# Patient Record
Sex: Male | Born: 1995 | Hispanic: Yes | Marital: Single | Smoking: Current some day smoker
Health system: Southern US, Community
[De-identification: ages and names within clinical notes are randomized; demographics above are authoritative.]

## PROBLEM LIST (undated history)

## (undated) DIAGNOSIS — F329 Major depressive disorder, single episode, unspecified: Secondary | ICD-10-CM

## (undated) DIAGNOSIS — R569 Unspecified convulsions: Secondary | ICD-10-CM

## (undated) HISTORY — DX: Unspecified convulsions: R56.9

## (undated) HISTORY — PX: TONSILLECTOMY: SUR1361

## (undated) HISTORY — DX: Major depressive disorder, single episode, unspecified: F32.9

---

## 2016-01-08 ENCOUNTER — Encounter: Payer: Self-pay | Admitting: Emergency Medicine

## 2016-01-08 ENCOUNTER — Emergency Department
Admission: EM | Admit: 2016-01-08 | Discharge: 2016-01-09 | Disposition: A | Discharge status: Other Acute Inpatient Hospital | Payer: 59 | Attending: Emergency Medicine | Admitting: Emergency Medicine

## 2016-01-08 DIAGNOSIS — F32A Depression, unspecified: Secondary | ICD-10-CM

## 2016-01-08 DIAGNOSIS — F329 Major depressive disorder, single episode, unspecified: Secondary | ICD-10-CM | POA: Diagnosis not present

## 2016-01-08 DIAGNOSIS — Z5181 Encounter for therapeutic drug level monitoring: Secondary | ICD-10-CM | POA: Insufficient documentation

## 2016-01-08 DIAGNOSIS — R45851 Suicidal ideations: Secondary | ICD-10-CM | POA: Diagnosis present

## 2016-01-08 DIAGNOSIS — F1290 Cannabis use, unspecified, uncomplicated: Secondary | ICD-10-CM | POA: Diagnosis not present

## 2016-01-08 HISTORY — DX: Depression, unspecified: F32.A

## 2016-01-08 LAB — CBC WITH DIFFERENTIAL/PLATELET
BASOS ABS: 0.1 10*3/uL (ref 0–0.1)
Basophils Relative: 1 %
EOS ABS: 0 10*3/uL (ref 0–0.7)
EOS PCT: 0 %
HCT: 40.8 % (ref 40.0–52.0)
HEMOGLOBIN: 13.8 g/dL (ref 13.0–18.0)
LYMPHS ABS: 1.9 10*3/uL (ref 1.0–3.6)
LYMPHS PCT: 22 %
MCH: 32 pg (ref 26.0–34.0)
MCHC: 33.8 g/dL (ref 32.0–36.0)
MCV: 94.7 fL (ref 80.0–100.0)
Monocytes Absolute: 1.2 10*3/uL — ABNORMAL HIGH (ref 0.2–1.0)
Monocytes Relative: 14 %
NEUTROS PCT: 63 %
Neutro Abs: 5.5 10*3/uL (ref 1.4–6.5)
PLATELETS: 247 10*3/uL (ref 150–440)
RBC: 4.31 MIL/uL — AB (ref 4.40–5.90)
RDW: 13.2 % (ref 11.5–14.5)
WBC: 8.7 10*3/uL (ref 3.8–10.6)

## 2016-01-08 LAB — URINE DRUG SCREEN, QUALITATIVE (ARMC ONLY)
AMPHETAMINES, UR SCREEN: NOT DETECTED
Barbiturates, Ur Screen: NOT DETECTED
Benzodiazepine, Ur Scrn: NOT DETECTED
COCAINE METABOLITE, UR ~~LOC~~: NOT DETECTED
Cannabinoid 50 Ng, Ur ~~LOC~~: POSITIVE — AB
MDMA (ECSTASY) UR SCREEN: NOT DETECTED
Methadone Scn, Ur: NOT DETECTED
Opiate, Ur Screen: NOT DETECTED
Phencyclidine (PCP) Ur S: NOT DETECTED
TRICYCLIC, UR SCREEN: NOT DETECTED

## 2016-01-08 LAB — COMPREHENSIVE METABOLIC PANEL
ALT: 26 U/L (ref 17–63)
ANION GAP: 6 (ref 5–15)
AST: 29 U/L (ref 15–41)
Albumin: 4.7 g/dL (ref 3.5–5.0)
Alkaline Phosphatase: 79 U/L (ref 38–126)
BUN: 16 mg/dL (ref 6–20)
CALCIUM: 9.3 mg/dL (ref 8.9–10.3)
CHLORIDE: 103 mmol/L (ref 101–111)
CO2: 28 mmol/L (ref 22–32)
CREATININE: 0.91 mg/dL (ref 0.61–1.24)
Glucose, Bld: 114 mg/dL — ABNORMAL HIGH (ref 65–99)
Potassium: 3.5 mmol/L (ref 3.5–5.1)
SODIUM: 137 mmol/L (ref 135–145)
Total Bilirubin: 1.2 mg/dL (ref 0.3–1.2)
Total Protein: 7.8 g/dL (ref 6.5–8.1)

## 2016-01-08 LAB — ETHANOL

## 2016-01-08 NOTE — ED Triage Notes (Addendum)
Pt presents to ED with c/o anxiety/panick attacks/depression, associated nausea. Pt states he he is been using marijuana and stopped using yesterday. Pt states he been depressed due his father passing. 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

## 2016-01-09 ENCOUNTER — Inpatient Hospital Stay
Admission: RE | Admit: 2016-01-09 | Discharge: 2016-01-16 | DRG: 885 | Disposition: A | Discharge status: Home / Self Care | Payer: No Typology Code available for payment source | Source: Intra-hospital | Attending: Psychiatry | Admitting: Psychiatry

## 2016-01-09 DIAGNOSIS — F323 Major depressive disorder, single episode, severe with psychotic features: Secondary | ICD-10-CM | POA: Diagnosis present

## 2016-01-09 DIAGNOSIS — F329 Major depressive disorder, single episode, unspecified: Secondary | ICD-10-CM | POA: Diagnosis present

## 2016-01-09 DIAGNOSIS — R45851 Suicidal ideations: Secondary | ICD-10-CM | POA: Diagnosis present

## 2016-01-09 DIAGNOSIS — G4700 Insomnia, unspecified: Secondary | ICD-10-CM | POA: Diagnosis present

## 2016-01-09 DIAGNOSIS — Z811 Family history of alcohol abuse and dependence: Secondary | ICD-10-CM

## 2016-01-09 DIAGNOSIS — F1220 Cannabis dependence, uncomplicated: Secondary | ICD-10-CM | POA: Diagnosis present

## 2016-01-09 DIAGNOSIS — Z7901 Long term (current) use of anticoagulants: Secondary | ICD-10-CM

## 2016-01-09 DIAGNOSIS — F29 Unspecified psychosis not due to a substance or known physiological condition: Secondary | ICD-10-CM

## 2016-01-09 MED ORDER — HYDROXYZINE HCL 25 MG PO TABS: 25.0000 mg | ORAL_TABLET | Freq: Three times a day (TID) | ORAL | Status: DC | PRN

## 2016-01-09 MED ORDER — INFLUENZA VAC SPLIT QUAD 0.5 ML IM SUSY
0.5000 mL | PREFILLED_SYRINGE | INTRAMUSCULAR | Status: AC
  Administered 2016-01-10: 0.5 mL via INTRAMUSCULAR
  Filled 2016-01-09: qty 0.5

## 2016-01-09 MED ORDER — ACETAMINOPHEN 325 MG PO TABS
650.0000 mg | ORAL_TABLET | Freq: Four times a day (QID) | ORAL | Status: DC | PRN
  Administered 2016-01-13: 650 mg via ORAL

## 2016-01-09 MED ORDER — TRAZODONE HCL 50 MG PO TABS: 50.0000 mg | ORAL_TABLET | Freq: Every evening | ORAL | Status: DC | PRN

## 2016-01-09 MED ORDER — ALUM & MAG HYDROXIDE-SIMETH 200-200-20 MG/5ML PO SUSP: 30.0000 mL | ORAL | Status: DC | PRN

## 2016-01-09 MED ORDER — MAGNESIUM HYDROXIDE 400 MG/5ML PO SUSP: 30.0000 mL | Freq: Every day | ORAL | Status: DC | PRN

## 2016-01-09 NOTE — Progress Notes (Signed)
[facility] by Dr. Toni Amendlapacs.  Attending Physician will be Dr. Ardyth HarpsHernandez.   [facility] Monica BectonKaneisha.   Intake Paper Work has been signed and placed on patient chart.  ER staff is aware of the admission [name]( Lisa, ER Sect.; Dr. Don PerkingVeronese, ER MD; Marchelle FolksAmanda, Patient's Nurse & [name]). [name], LCAS-A, LPC-A, [facility]  Counselor 01/09/2016 1:17 PM

## 2016-01-09 NOTE — ED Notes (Signed)
Pt presents to ED with c/o depression, pt states "I have been feeling depressed and I have been doing marijuana and know I am detoxing from it," Pt denies use of marijuana today. Pt reports having thoughts of SI but no plan. Pt appears anxious but is cooperative. Pt alert and oriented x 4.

## 2016-01-09 NOTE — Consult Note (Signed)
[facility] Face-to-Face Psychiatry Consult   Reason for Consult:  Consult for this 20 year old student from [employer] brought in because of depression with suicidal thoughts. Referring Physician:  Eual Fines Patient Identification: Dillon Shannon MRN:  [id number] Principal Diagnosis: Severe major depression, single episode, with psychotic features [facility]) Diagnosis:   Patient Active Problem List   Diagnosis Date Noted  . Severe major depression, single episode, with psychotic features ([place]) [F32.3] 01/09/2016  . Cannabis abuse [F12.10] 01/09/2016    Total Time spent with patient: 1 hour  Subjective:   Dillon Shannon is a 20 y.o. male patient admitted with "I am anxious, and my heart hurts".  HPI:  Patient interviewed. Chart reviewed. Labs and vitals reviewed. 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 Patient was a little bit difficult to interview today not because of any language problem but because of his mental state. He seemed to be very slow and sluggish in his thinking. He tells me that he has only been depressed for about 2 days. Prior to that he said he had been smoking marijuana heavily every day for the past month. He says that he was doing that so that he could "escape" from his life problems although it's a little vague what those are. He admits that he has been sleeping poorly and eating poorly for several weeks. Has been attending classes less than usual and not doing well in classes. 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 Patient had made some indications about having some suicidal thoughts without specific plan when he came in. When I spoke to him today he said that he did not have any specific thoughts about killing himself but he felt terrible about himself very hopeless. He answered positively that he had been having hallucinations although he would not  give me any further details than that. Seem to be distressed when we talked about it. Patient claimed to me that he had had a large amount of alcohol yesterday although his blood alcohol level on presentation was negative. He is not currently receiving any kind of outpatient psychiatric treatment. Additional stressors include worry about his family. He says he has an uncle who died recently although he doesn't give me any more detail about that. Evidently his father died about 2 years ago and that was a major and traumatic event in his life.  Social history: Patient is from [place] and tells me that he does not have any family here in the [place]. 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 He says there is a family who is helping to support him in school and he feels very guilty about how he is "letting down" those people among others.  Medical history: Denies any significant medical problems  Substance abuse history: Says that he was smoking a little bit of marijuana recreationally over the summer but that for the past month he has been doing it every day. He says he stopped 2 days ago and now indicates that he feels overwhelmed with guilt about the whole situation. Denies he was using any other drugs. Claims he used a lot of alcohol yesterday again but his alcohol level was negative.  Past Psychiatric History: Patient denies any past psychiatric treatment at all. Says he's never seen a counselor or therapist of any sort. No psychiatric hospitalization. No history of suicide attempts. No history of psychiatric medicine.  Risk to Self: Suicidal Ideation: Yes-Currently Present Suicidal Intent: No Is patient at risk for suicide?: No Suicidal Plan?: No Access to Means: No What has been your use of drugs/alcohol within the last 12 months?: use of marijuana daily, rare use of alcohol How many times?: 0 Other Self Harm Risks: denied Triggers for Past Attempts: Unknown Intentional Self  Injurious Behavior: None Risk to Others: Homicidal Ideation: No Thoughts of Harm to Others: No Current Homicidal Intent: No Current Homicidal Plan: No Access to Homicidal Means: No Identified Victim: None identified History of harm to others?: No Assessment of Violence: None Noted Violent Behavior Description: denied Does patient have access to weapons?: No Criminal Charges Pending?: No Does patient have a court date: No Prior Inpatient Therapy: Prior Inpatient Therapy: Yes Prior Therapy Dates: 2015 Prior Therapy Facilty/Provider(s): [facility] in [place] Reason for Treatment: Depression, PTSD Prior Outpatient Therapy: Prior Outpatient Therapy: No Prior Therapy Dates: n/a Prior Therapy Facilty/Provider(s): n/a Reason for Treatment: n/a Does patient have an ACCT team?: No Does patient have Intensive In-House Services?  : No Does patient have [facility] services? : No Does patient have P4CC services?: No  Past Medical History: History reviewed. No pertinent past medical history. History reviewed. No pertinent surgical history. Family History: History reviewed. No pertinent family history. Family Psychiatric  History: Patient said that his mother was "not right" but couldn't describe her in any more detail than that. His late father had a drinking problem and a problem with violent behavior when intoxicated. Social History:  History  Alcohol use Not on file     History  Drug Use  . Types: Marijuana    Social History   Social History  . Marital status: Single    Spouse name: N/A  . Number of children: N/A  . Years of education: N/A   Social History Main Topics  . Smoking status: Never Smoker  . Smokeless tobacco: Never Used  . Alcohol use None  . Drug use:     Types: Marijuana  . Sexual activity: Not Asked   Other Topics Concern  . None   Social History Narrative  . None   Additional Social History:    Allergies:  No Known Allergies  Labs:  Results for orders  placed or performed during the hospital encounter of 01/08/16 (from the past 48 hour(s))  Comprehensive metabolic panel     Status: Abnormal   Collection Time: 01/08/16 10:56 PM  Result Value Ref Range   Sodium 137 135 - 145 mmol/L   Potassium 3.5 3.5 - 5.1 mmol/L   Chloride 103 101 - 111 mmol/L   CO2 28 22 - 32 mmol/L   Glucose, Bld 114 (H) 65 - 99 mg/dL   BUN 16 6 - 20 mg/dL   Creatinine, Ser 0.91 0.61 - 1.24 mg/dL   Calcium 9.3 8.9 - 10.3 mg/dL   Total Protein 7.8 6.5 - 8.1 g/dL   Albumin 4.7 3.5 - 5.0 g/dL   AST 29 15 - 41 U/L   ALT 26 17 - 63 U/L   Alkaline Phosphatase 79 38 - 126 U/L   Total Bilirubin 1.2 0.3 - 1.2 mg/dL   GFR calc non Af Amer >60 >60 mL/min   GFR calc Af Amer >60 >60 mL/min    Comment: (NOTE) The eGFR has been calculated using the CKD EPI equation. This calculation has not been validated in all clinical situations. eGFR's persistently <60 mL/min signify possible Chronic Kidney Disease.    Anion gap 6  5 - 15  Ethanol     Status: None   Collection Time: 01/08/16 10:56 PM  Result Value Ref Range   Alcohol, Ethyl (B) <5 <5 mg/dL    Comment:        LOWEST DETECTABLE LIMIT FOR SERUM ALCOHOL IS 5 mg/dL FOR MEDICAL PURPOSES ONLY   CBC with Diff     Status: Abnormal   Collection Time: 01/08/16 10:56 PM  Result Value Ref Range   WBC 8.7 3.8 - 10.6 K/uL   RBC 4.31 (L) 4.40 - 5.90 MIL/uL   Hemoglobin 13.8 13.0 - 18.0 g/dL   HCT 40.8 40.0 - 52.0 %   MCV 94.7 80.0 - 100.0 fL   MCH 32.0 26.0 - 34.0 pg   MCHC 33.8 32.0 - 36.0 g/dL   RDW 13.2 11.5 - 14.5 %   Platelets 247 150 - 440 K/uL   Neutrophils Relative % 63 %   Neutro Abs 5.5 1.4 - 6.5 K/uL   Lymphocytes Relative 22 %   Lymphs Abs 1.9 1.0 - 3.6 K/uL   Monocytes Relative 14 %   Monocytes Absolute 1.2 (H) 0.2 - 1.0 K/uL   Eosinophils Relative 0 %   Eosinophils Absolute 0.0 0 - 0.7 K/uL   Basophils Relative 1 %   Basophils Absolute 0.1 0 - 0.1 K/uL  Urine Drug Screen, Qualitative ([facility] only)      Status: Abnormal   Collection Time: 01/08/16 10:56 PM  Result Value Ref Range   Tricyclic, Ur Screen NONE DETECTED NONE DETECTED   Amphetamines, Ur Screen NONE DETECTED NONE DETECTED   MDMA (Ecstasy)Ur Screen NONE DETECTED NONE DETECTED   Cocaine Metabolite,Ur [place] NONE DETECTED NONE DETECTED   Opiate, Ur Screen NONE DETECTED NONE DETECTED   Phencyclidine (PCP) Ur S NONE DETECTED NONE DETECTED   Cannabinoid 50 Ng, Ur [place] POSITIVE (A) NONE DETECTED   Barbiturates, Ur Screen NONE DETECTED NONE DETECTED   Benzodiazepine, Ur Scrn NONE DETECTED NONE DETECTED   Methadone Scn, Ur NONE DETECTED NONE DETECTED    Comment: (NOTE) 161  Tricyclics, urine               Cutoff 1000 ng/mL 200  Amphetamines, urine             Cutoff 1000 ng/mL 300  MDMA (Ecstasy), urine           Cutoff 500 ng/mL 400  Cocaine Metabolite, urine       Cutoff 300 ng/mL 500  Opiate, urine                   Cutoff 300 ng/mL 600  Phencyclidine (PCP), urine      Cutoff 25 ng/mL 700  Cannabinoid, urine              Cutoff 50 ng/mL 800  Barbiturates, urine             Cutoff 200 ng/mL 900  Benzodiazepine, urine           Cutoff 200 ng/mL 1000 Methadone, urine                Cutoff 300 ng/mL 1100 1200 The urine drug screen provides only a preliminary, unconfirmed 1300 analytical test result and should not be used for non-medical 1400 purposes. Clinical consideration and professional judgment should 1500 be applied to any positive drug screen result due to possible 1600 interfering substances. A more specific alternate chemical method 1700 must be used in order to obtain a confirmed  analytical result.  1800 Gas chromato graphy / mass spectrometry (GC/MS) is the preferred 1900 confirmatory method.     No current facility-administered medications for this encounter.    No current outpatient prescriptions on file.    Musculoskeletal: Strength & Muscle Tone: within normal limits Gait & Station: normal Patient leans:  N/A  Psychiatric Specialty Exam: Physical Exam  Nursing note and vitals reviewed. Constitutional: He appears well-developed and well-nourished.  HENT:  Head: Normocephalic and atraumatic.  Eyes: Conjunctivae are normal. Pupils are equal, round, and reactive to light.  Neck: Normal range of motion.  Cardiovascular: Regular rhythm and normal heart sounds.   Respiratory: Effort normal. No respiratory distress.  GI: Soft.  Musculoskeletal: Normal range of motion.  Neurological: He is alert.  Skin: Skin is warm and dry.  Psychiatric: His mood appears anxious. His affect is blunt. His speech is delayed. He is slowed and withdrawn. Cognition and memory are normal. He expresses impulsivity. He exhibits a depressed mood. He expresses suicidal ideation. He expresses no suicidal plans.    Review of Systems  Constitutional: Negative.   HENT: Negative.   Eyes: Negative.   Respiratory: Negative.   Cardiovascular: Negative.   Gastrointestinal: Negative.   Musculoskeletal: Negative.   Skin: Negative.   Neurological: Negative.   Psychiatric/Behavioral: Positive for depression, hallucinations, memory loss, substance abuse and suicidal ideas. The patient is nervous/anxious and has insomnia.     Blood pressure 128/82, pulse 72, temperature 98 F (36.7 C), temperature source Oral, resp. rate 18, height 5' 11"  (1.803 m), weight 75 kg (165 lb 5.5 oz), SpO2 100 %.Body mass index is 23.06 kg/m.  General Appearance: Guarded  Eye Contact:  Minimal  Speech:  Slow  Volume:  Decreased  Mood:  Depressed and Dysphoric  Affect:  Blunt, Constricted and Depressed  Thought Process:  Irrelevant  Orientation:  Full (Time, Place, and Person)  Thought Content:  Tangential  Suicidal Thoughts:  Yes.  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 Patient looks extremely depressed today. He is withdrawn. Makes poor eye contact. Answers questions minimally and often with vague and sometimes confusing replies. Differential diagnosis includes major depression, substance-induced mood symptoms and even the possibility of new onset psychosis given how impaired he seems to still be in his ability to talk and give a cogent history. Patient does not have any family living in the [place] from what he says. I am going to up old commitment and admit him to the psychiatric unit. Treatment team downstairs can complete evaluation and decide what is the best course of further treatment for this young man.  Disposition: Recommend psychiatric Inpatient admission when medically cleared. Supportive therapy provided about ongoing stressors.  Alethia Berthold, MD 01/09/2016 12:57 PM

## 2016-01-09 NOTE — ED Notes (Signed)
Pt awake and alert in dayroom requesting to be transferred to inpatient unit now. Repeatedly explained the process for his discharge here in [facility] and transfer to inpatient unit (BMU). Pt insists he wants to know how he can leave now. Easily redirected, but continuously insists to "leave." Asks "what do I need to do to get out of here." Somewhat intrusive when interacting with this Clinical research associatewriter. Safety maintained with every 15 minute checks and security cameras in place. Will continue to monitor.

## 2016-01-09 NOTE — ED Notes (Signed)
Awake and alert in room, cooperative with no acute distress noted. Doctor at bedside for consult. Safety maintained with every 15 minute checks and security cameras in place. Will continue to monitor.

## 2016-01-09 NOTE — ED Notes (Addendum)
Pt to be transferred to BMU room 312. Report given to Deanna ArtisKeisha, RN [facility](BMU), verbalized understanding of transfer. Pt informed of transfer and plan to transport to BMU. Cooperative at this time. In no acute distress. VS stable. Belongings checked to be transferred with pt. Safety maintained with every 15 minute checks and security cameras in place. Will continue to monitor and transfer to room 312 once transfer is complete.

## 2016-01-09 NOTE — ED Notes (Signed)
Awake and alert in dayroom with no acute distress noted. Calm and cooperative at this time. Safety maintained with every 15 minute checks and security cameras in place. Will continue to monitor.

## 2016-01-09 NOTE — ED Notes (Signed)
ENVIRONMENTAL ASSESSMENT Potentially harmful objects out of patient reach: Yes Personal belongings secured: Yes Patient dressed in hospital provided attire only: Yes Plastic bags out of patient reach: Yes Patient care equipment (cords, cables, call bells, lines, and drains) shortened, removed, or accounted for: Yes Equipment and supplies removed from bottom of stretcher: Yes Potentially toxic materials out of patient reach: Yes Sharps container removed or out of patient reach: Yes  Patient is in room sleeping. No signs of distress noted. Maintained on 15 minute checks and observation by security camera for safety.

## 2016-01-09 NOTE — ED Notes (Signed)
Awake and alert complaining of not being able to sleep. Pt reports he is here because he was "using marijuana to cover up traumatizing events that happened in his life." Reports that "i've dealt with much worse, so I know I can overcome this." Denies SI/HI/AVH. Does report feeling paranoid. Reports using marijuana last on Friday. Says he used marijuana daily.Pt cooperative. Requesting to see psychiatrist. Support and encouragement provided. Every 15 minute checks for safety and observation by security cameras. Safety maintained. No acute distress noted. Will continue to monitor.

## 2016-01-09 NOTE — Tx Team (Signed)
Initial Treatment Plan 01/09/2016 6:19 PM Dillon OldenSalvador Spivack ZOX:[id number]    PATIENT STRESSORS: Educational concerns Substance abuse   PATIENT STRENGTHS: Capable of independent living Wellsite geologistCommunication skills General fund of knowledge Motivation for treatment/growth   PATIENT IDENTIFIED PROBLEMS: Major Depressive Disorder                     DISCHARGE CRITERIA:  Improved stabilization in mood, thinking, and/or behavior Motivation to continue treatment in a less acute level of care  PRELIMINARY DISCHARGE PLAN: Outpatient therapy  PATIENT/FAMILY INVOLVEMENT: This treatment plan has been presented to and reviewed with the patient, Dillon Shannon, and/or family member.  The patient and family have been given the opportunity to ask questions and make suggestions.  Santo HeldNakisha D Weber Monnier, RN 01/09/2016, 6:19 PM

## 2016-01-09 NOTE — BH Assessment (Signed)
Assessment Note  Dillon Shannon is an 20 y.o. male. Dillon Shannon arrived to the ED by personal transportation with his Tennis coach.  He reports that "I have serious mental problems". He reports symptoms of depression. He states "I haven't been doing much. Just smoking." He reports isolating himself, Sleeping less, Eating less, worrying and racing thoughts.  He reports symptoms of anxiety. He states that he feels excessive energy. He denied having auditory or visual hallucinations.  He reports having suicidal thoughts, but denied a plan or intent. He denied homicidal ideation or intent. He report use of alcohol on 01/07/2016. He reports use of marijuana often. He reports that he is currently under relationship stress, which he defined as personal. He reports that he witnessed his father die in a hospital in [place], that was severely traumatic for him.  He states that he has had problems since that time.  Coach reports that he is having physical pain. Dillon Shannon supportive services were contacted by the coach.  Services are to be started tomorrow.   Diagnosis: Major Depressive Disorder  Past Medical History: History reviewed. No pertinent past medical history.  History reviewed. No pertinent surgical history.  Family History: History reviewed. No pertinent family history.  Social History:  reports that he has never smoked. He has never used smokeless tobacco. He reports that he uses drugs, including Marijuana. His alcohol history is not on file.  Additional Social History:  Alcohol / Drug Use History of alcohol / drug use?: Yes Substance #1 Name of Substance 1: Marijuana 1 - Age of First Use: 18 1 - Amount (size/oz): 3 bowls daily 1 - Frequency: 3 times a day 1 - Last Use / Amount: 01/07/2016 Substance #2 Name of Substance 2: Alcohol 2 - Age of First Use: unsure 2 - Amount (size/oz): "Too much" 2 - Frequency: Rarely 2 - Last Use / Amount: 01/07/2016  CIWA: CIWA-Ar BP: (!) 165/90 Pulse  Rate: 78 COWS:    Allergies: No Known Allergies  Home Medications:  (Not in a hospital admission)  OB/GYN Status:  No LMP for male patient.  General Assessment Data Location of Assessment: [facility] ED TTS Assessment: In system Is this a Tele or Face-to-Face Assessment?: Face-to-Face Is this an Initial Assessment or a Re-assessment for this encounter?: Initial Assessment Marital status: Single Maiden name: n/a Is patient pregnant?: No Pregnancy Status: No Living Arrangements: Non-relatives/Friends Data processing manager) Can pt return to current living arrangement?: Yes Admission Status: Involuntary Is patient capable of signing voluntary admission?: Yes Referral Source: Self/Family/Friend Insurance type: None  Medical Screening Exam [facility] Walk-in ONLY) Medical Exam completed: Yes  Crisis Care Plan Living Arrangements: Non-relatives/Friends Data processing manager) Legal Guardian: Other: (Self) Name of Psychiatrist: None Name of Therapist: None  Education Status Is patient currently in school?: Yes Current Grade: College Highest grade of school patient has completed: 12th Name of school: Ambulance person person: n/a  Risk to self with the past 6 months Suicidal Ideation: Yes-Currently Present Has patient been a risk to self within the past 6 months prior to admission? : No Suicidal Intent: No Has patient had any suicidal intent within the past 6 months prior to admission? : No Is patient at risk for suicide?: No Suicidal Plan?: No Has patient had any suicidal plan within the past 6 months prior to admission? : No Access to Means: No What has been your use of drugs/alcohol within the last 12 months?: use of marijuana daily, rare use of alcohol Previous Attempts/Gestures: No (Patient denied) How  many times?: 0 Other Self Harm Risks: denied Triggers for Past Attempts: Unknown Intentional Self Injurious Behavior: None Family Suicide History: No Recent stressful life event(s): Other (Comment)  (Relationship ) Depression: Yes Depression Symptoms: Despondent, Insomnia, Isolating, Feeling worthless/self pity Substance abuse history and/or treatment for substance abuse?: No Suicide prevention information given to non-admitted patients: Not applicable  Risk to Others within the past 6 months Homicidal Ideation: No Does patient have any lifetime risk of violence toward others beyond the six months prior to admission? : No Thoughts of Harm to Others: No Current Homicidal Intent: No Current Homicidal Plan: No Access to Homicidal Means: No Identified Victim: None identified History of harm to others?: No Assessment of Violence: None Noted Violent Behavior Description: denied Does patient have access to weapons?: No Criminal Charges Pending?: No Does patient have a court date: No Is patient on probation?: No  Psychosis Hallucinations: None noted Delusions: None noted  Mental Status Report Appearance/Hygiene: In scrubs Eye Contact: Fair Motor Activity: Unremarkable Speech: Slow, Soft Level of Consciousness: Alert Mood: Depressed Affect: Flat Anxiety Level: Moderate Thought Processes: Coherent Judgement: Unimpaired Orientation: Person, Place, Time, Situation Obsessive Compulsive Thoughts/Behaviors: None  Cognitive Functioning Concentration: Decreased Memory: Recent Intact IQ: Average Insight: Fair Impulse Control: Fair Appetite: Poor Sleep: Decreased Vegetative Symptoms: None  ADLScreening [facility] Assessment Services) Patient's cognitive ability adequate to safely complete daily activities?: Yes Patient able to express need for assistance with ADLs?: Yes Independently performs ADLs?: Yes (appropriate for developmental age)  Prior Inpatient Therapy Prior Inpatient Therapy: Yes Prior Therapy Dates: 2015 Prior Therapy Facilty/Provider(s): [place] Reason for Treatment: Depression, PTSD  Prior Outpatient Therapy Prior Outpatient Therapy: No Prior  Therapy Dates: n/a Prior Therapy Facilty/Provider(s): n/a Reason for Treatment: n/a Does patient have an ACCT team?: No Does patient have Intensive In-House Services?  : No Does patient have [facility] services? : No Does patient have P4CC services?: No  ADL Screening (condition at time of admission) Patient's cognitive ability adequate to safely complete daily activities?: Yes Patient able to express need for assistance with ADLs?: Yes Independently performs ADLs?: Yes (appropriate for developmental age)       Abuse/Neglect Assessment (Assessment to be complete while patient is alone) Physical Abuse: Denies Verbal Abuse: Denies Sexual Abuse: Denies Exploitation of patient/patient's resources: Denies Self-Neglect: Denies     Merchant navy officerAdvance Directives (For Healthcare) Does patient have an advance directive?: No Would patient like information on creating an advanced directive?: No - patient declined information    Additional Information 1:1 In Past 12 Months?: No CIRT Risk: No Elopement Risk: No Does patient have medical clearance?: Yes     Disposition:  Disposition Initial Assessment Completed for this Encounter: Yes Disposition of Patient: Other dispositions  On Site Evaluation by:   Reviewed with Physician:    Justice DeedsKeisha Ruberta Holck 01/09/2016 1:33 AM

## 2016-01-09 NOTE — ED Notes (Signed)
Awake in room eating flood/fluids provided for lunch. Pt's tennis coach (elon) here to visit with Engineer, materialssecurity officer nearby. Cooperative at this time, but voices no interest in being admitted inpatient although he is aware he will be admitted. Safety maintained with every 15 minute checks for safety and security cameras in place. Will continue to monitor.

## 2016-01-09 NOTE — Progress Notes (Signed)
Admission Note:   Patient admitted to the unit at approximately 1545 pm.  Patient arrived in scrubs with a steady gait and a flat affect.  Patient denies SI/AVH/HI at this time.  Patient skin assessment completed with another staff present and no issues were found.  Patient belongings logged and searched by staff.  Patient oriented to the unit and to his room.

## 2016-01-09 NOTE — ED Notes (Signed)
Awake and alert in dayroom watching Tv. Calm and cooperative. No acute distress noted. Safety maintained with every 15 minute checks and security cameras for safety. Will continue to monitor.

## 2016-01-09 NOTE — ED Notes (Signed)
Pt resting in room with no acute distress noted. Cooperative, but anxious. Safety maintained with every 15 minute checks and security cameras in place. Will continue to monitor.

## 2016-01-09 NOTE — ED Notes (Signed)
Pt verbally gave permission for this RN to provide his coach, Dillon Shannon [phone], password. Dillon Shannon was provided with password and behavioral medicine visitation information.

## 2016-01-09 NOTE — Progress Notes (Signed)
Angela BurkeMarie Shaw, Director of Counseling, [facility] [phone] contacted [facility] to report that [facility] will be following pt's case once discharged, but wanted hospital to be aware that there is no Psychiatrist on staff with [facility], and requests/recommends that IF outpatient therapy is ordered on discharge that pt be referred to another source for psychiatric care.

## 2016-01-09 NOTE — ED Notes (Signed)
Pt awake, alert in dayroom with no acute distress noted. Requesting to gather his belongings to leave. Reviewed IVC with pt, verbalizes understanding, but continues to request to leave. Requests to call his coach, allowed a phone call to coach. Safety maintained with every 15 minute checks and security cameras in place. Will continue to monitor.

## 2016-01-09 NOTE — ED Provider Notes (Signed)
[facility] Emergency Department Provider Note   ____________________________________________   First MD Initiated Contact with Patient 01/09/16 0033     (approximate)  I have reviewed the triage vital signs and the nursing notes.   HISTORY  Chief Complaint Anxiety    HPI Dillon Shannon is a 20 y.o. male who comes into the hospital today because he's been abusing marijuana. According to the patient he uses a lot of marijuana. He reports that it has not been today but it has been over some time. He reports that he had a drug test at school and he told his coach that he was depressed and he was using lead to treat his depression. He reports that his coach brought him in for evaluation. He reports that he's had problems with depression in the past his never been on any medication. The patient reports that he's been using no other drugs but he did drink some alcohol yesterday. The patient has been having suicidal thoughts but he reports that he doesn't know exactly how long. He reports that he is just trying to forget everything. The patient reports that when he doesn't smoke marijuana he feels overwhelmed. The patient was brought in for evaluation today. He has no chest pain, no abdominal pain no headache or blurred vision.   History reviewed. No pertinent past medical history.  There are no active problems to display for this patient.   History reviewed. No pertinent surgical history.  Prior to Admission medications   Not on File    Allergies Review of patient's allergies indicates no known allergies.  History reviewed. No pertinent family history.  Social History Social History  Substance Use Topics  . Smoking status: Never Smoker  . Smokeless tobacco: Never Used  . Alcohol use Not on file    Review of Systems Constitutional: No fever/chills Eyes: No visual changes. ENT: No sore throat. Cardiovascular: Denies chest pain. Respiratory:  Denies shortness of breath. Gastrointestinal: No abdominal pain.  No nausea, no vomiting.  No diarrhea.  No constipation. Genitourinary: Negative for dysuria. Musculoskeletal: Negative for back pain. Skin: Negative for rash. Neurological: Negative for headaches, focal weakness or numbness. Psych: Depression and suicidal ideation  10-point ROS otherwise negative.  ____________________________________________   PHYSICAL EXAM:  VITAL SIGNS: ED Triage Vitals  Enc Vitals Group     BP 01/08/16 2242 (!) 165/90     Pulse Rate 01/08/16 2242 78     Resp 01/08/16 2242 16     Temp 01/08/16 2242 98.2 F (36.8 C)     Temp Source 01/08/16 2242 Oral     SpO2 01/08/16 2242 99 %     Weight 01/08/16 2243 165 lb 5.5 oz (75 kg)     Height 01/08/16 2243 5\' 11"  (1.803 m)     Head Circumference --      Peak Flow --      Pain Score 01/08/16 2244 0     Pain Loc --      Pain Edu? --      Excl. in GC? --     Constitutional: Alert and oriented. Well appearing and in no acute distress. Eyes: Conjunctivae are normal. PERRL. EOMI. Head: Atraumatic. Nose: No congestion/rhinnorhea. Mouth/Throat: Mucous membranes are moist.  Oropharynx non-erythematous. Cardiovascular: Normal rate, regular rhythm. Grossly normal heart sounds.  Good peripheral circulation. Respiratory: Normal respiratory effort.  No retractions. Lungs CTAB. Gastrointestinal: Soft and nontender. No distention. Positive bowel sounds Musculoskeletal: No lower extremity tenderness nor edema.  Neurologic:  Normal speech and language.  Skin:  Skin is warm, dry and intact.  Psychiatric: Flat affect, does not make eye contact..  ____________________________________________   LABS (all labs ordered are listed, but only abnormal results are displayed)  Labs Reviewed  COMPREHENSIVE METABOLIC PANEL - Abnormal; Notable for the following:       Result Value   Glucose, Bld 114 (*)    All other components within normal limits  CBC WITH  DIFFERENTIAL/PLATELET - Abnormal; Notable for the following:    RBC 4.31 (*)    Monocytes Absolute 1.2 (*)    All other components within normal limits  URINE DRUG SCREEN, QUALITATIVE ([facility] ONLY) - Abnormal; Notable for the following:    Cannabinoid 50 Ng, Ur [place] POSITIVE (*)    All other components within normal limits  ETHANOL   ____________________________________________  EKG  none ____________________________________________  RADIOLOGY  none ____________________________________________   PROCEDURES  Procedure(s) performed: None  Procedures  Critical Care performed: No  ____________________________________________   INITIAL IMPRESSION / ASSESSMENT AND PLAN / ED COURSE  Pertinent labs & imaging results that were available during my care of the patient were reviewed by me and considered in my medical decision making (see chart for details).  This is a 20 year old male who comes into the hospital today with depression and substance abuse. The patient reports that he has been having a difficult time coping with being at school and he also told our nurse that he witnessed his father being killed. He reports that he's had some stress from that and he's having a feeling of being overwhelmed. Given the patient's feelings of suicidal ideation and did involuntarily commit the patient and I will have him evaluated by psych. The patient has no further complaints or concerns at this time.  Clinical Course     ____________________________________________   FINAL CLINICAL IMPRESSION(S) / ED DIAGNOSES  Final diagnoses:  Depression, unspecified depression type  Suicidal ideation      NEW MEDICATIONS STARTED DURING THIS VISIT:  New Prescriptions   No medications on file     Note:  This document was prepared using Dragon voice recognition software and may include unintentional dictation errors.    Rebecka Apley, MD 01/09/16 [phone]

## 2016-01-09 NOTE — ED Notes (Signed)
Pt provided with Malaw[place]urkey tray and water to drink.

## 2016-01-09 NOTE — ED Notes (Signed)
Patient assigned to appropriate care area. Patient oriented to unit/care area: Informed that, for their safety, care areas are designed for safety and monitored by security cameras at all times; and visiting hours explained to patient. Patient verbalizes understanding, and verbal contract for safety obtained. 

## 2016-01-10 ENCOUNTER — Encounter: Payer: Self-pay | Admitting: Psychiatry

## 2016-01-10 DIAGNOSIS — F1220 Cannabis dependence, uncomplicated: Secondary | ICD-10-CM

## 2016-01-10 DIAGNOSIS — F323 Major depressive disorder, single episode, severe with psychotic features: Secondary | ICD-10-CM

## 2016-01-10 LAB — TSH: TSH: 2.517 u[IU]/mL (ref 0.350–4.500)

## 2016-01-10 LAB — LIPID PANEL
CHOL/HDL RATIO: 1.8 ratio
Cholesterol: 121 mg/dL (ref 0–200)
HDL: 67 mg/dL (ref >40)
LDL CALC: 43 mg/dL (ref 0–99)
Triglycerides: 53 mg/dL (ref <150)
VLDL: 11 mg/dL (ref 0–40)

## 2016-01-10 MED ORDER — RISPERIDONE 1 MG PO TABS
0.5000 mg | ORAL_TABLET | Freq: Three times a day (TID) | ORAL | Status: DC
  Administered 2016-01-10 – 2016-01-11 (×2): 0.5 mg via ORAL
  Filled 2016-01-10 (×3): qty 1

## 2016-01-10 MED ORDER — TRAZODONE HCL 50 MG PO TABS
50.0000 mg | ORAL_TABLET | Freq: Every day | ORAL | Status: DC
  Administered 2016-01-10: 50 mg via ORAL
  Filled 2016-01-10: qty 1

## 2016-01-10 MED ORDER — RISPERIDONE 1 MG PO TABS: 0.5000 mg | ORAL_TABLET | Freq: Two times a day (BID) | ORAL | Status: DC

## 2016-01-10 MED ORDER — FLUOXETINE HCL 10 MG PO CAPS
10.0000 mg | ORAL_CAPSULE | Freq: Every day | ORAL | Status: DC
  Administered 2016-01-10 – 2016-01-16 (×7): 10 mg via ORAL
  Filled 2016-01-10 (×7): qty 1
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
## 2016-01-10 NOTE — [facility] Group Notes (Signed)
[facility] Group Notes:  (Nursing/MHT/Case Management/Adjunct)  Date:  01/10/2016  Time:  3:47 PM  Type of Therapy:  Psychoeducational Skills  Participation Level:  Did Not Attend    Mickey Farberamela M Moses Ellison 01/10/2016, 3:47 PM
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
## 2016-01-10 NOTE — [facility] Group Notes (Signed)
[facility] LCSW Group Therapy   01/10/2016  1 pm  Type of Therapy: Group Therapy   Participation Level: Did Not Attend. Patient invited to participate but declined.    Zakai Gonyea F. Alia Parsley, MSW, LCSWA, LCAS     

## 2016-01-10 NOTE — Progress Notes (Signed)
Recreation Therapy Notes  Date: 10.03.17 Time: 9:30 am Location: Craft Room  Group Topic: Goal Setting  Goal Area(s) Addresses:  Patient will write at least one goal. Patient will write at least one obstacle.  Behavioral Response: Attentive, Interactive  Intervention: Recovery Goal Chart  Activity: Patients were instructed to make a Recovery Goal Chart including their goals, obstacles, the date they started working on their goals, and the date they achieved their goals.  Education: LRT educated patients on healthy ways to celebrate reaching their goals.  Education Outcome: Acknowledges education/In group clarification offered  Clinical Observations/Feedback: Patient completed activity by writing goals and obstacles. Patient contributed to group discussion by stating how he can keep himself focused on his goals and how he can overcome his obstacles.  Jacquelynn CreeGreene,Connie Hilgert M, LRT/CTRS 01/10/2016 11:54 AM

## 2016-01-10 NOTE — [facility] Group Notes (Signed)
[facility] Group Notes:  (Nursing/MHT/Case Management/Adjunct)  Date:  01/10/2016  Time:  4:12 AM  Type of Therapy:  Group Therapy  Participation Level:  Active  Participation Quality:  Appropriate  Affect:  Appropriate  Cognitive:  Appropriate  Insight:  Appropriate  Engagement in Group:  Engaged  Modes of Intervention:  n/a  Summary of Progress/Problems:  Dillon Shannon 01/10/2016, 4:12 AM

## 2016-01-10 NOTE — [facility] Group Notes (Signed)
Goals Group  Date/Time: 01/10/2016 9am  Type of Therapy and Topic: Group Therapy: Goals Group: SMART Goals   Pt was called, but did not attend   Kiya Eno F. Kiylee Thoreson, LCSWA, LCAS    

## 2016-01-10 NOTE — [facility] Suicide Risk Assessment (Signed)
[facility]   Nursing information obtained from:  Patient Demographic factors:    Current Mental Status:    Loss Factors:    Historical Factors:    Risk Reduction Factors:     Total Time spent with patient: 1 hour Principal Problem: MDD (major depressive disorder), single episode, severe (HCC) Diagnosis:   Patient Active Problem List   Diagnosis Date Noted  . Cannabis use disorder, severe, dependence (HCC) [F12.20] 01/10/2016  . MDD (major depressive disorder), single episode, severe (HCC) [F32.2] 01/10/2016   Subjective Data:   Continued Clinical Symptoms:  Alcohol Use Disorder Identification Test Final Score (AUDIT): 12 The "Alcohol Use Disorders Identification Test", Guidelines for Use in Primary Care, Second Edition.  World Science writerHealth Organization [facility](WHO). Score between 0-7:  no or low risk or alcohol related problems. Score between 8-15:  moderate risk of alcohol related problems. Score between 16-19:  high risk of alcohol related problems. Score 20 or above:  warrants further diagnostic evaluation for alcohol dependence and treatment.   CLINICAL FACTORS:   Depression:   Comorbid alcohol abuse/dependence Insomnia Severe Alcohol/Substance Abuse/Dependencies   Musculoskeletal:   Psychiatric Specialty Exam: Physical Exam  ROS  Blood pressure (!) 156/92, pulse 64, temperature 98.6 F (37 C), temperature source Oral, resp. rate 16, height 5\' 10"  (1.778 m), weight 73 kg (161 lb), SpO2 100 %.Body mass index is 23.1 kg/m.                                                    Sleep:  Number of Hours: 5.25      COGNITIVE FEATURES THAT CONTRIBUTE TO RISK:  Closed-mindedness    SUICIDE RISK:   Moderate:  Frequent suicidal ideation with limited intensity, and duration, some specificity in terms of plans, no associated intent, good self-control, limited dysphoria/symptomatology, some risk factors present, and identifiable  protective factors, including available and accessible social support.   [facility]  I certify that inpatient services furnished can reasonably be expected to improve the patient's condition.  Jimmy FootmanHernandez-Gonzalez,  Halli Equihua, MD 01/10/2016, 2:53 PM

## 2016-01-10 NOTE — Progress Notes (Signed)
[facility]  01/11/2016 9:14 AM Dillon Shannon  MRN:  [id number]   Subjective:  20 year old single [place] male who is currently a Consulting civil engineerstudent at [employer]. Patient is a Armed forces operational officertennis player for [employer]. Patient came in voluntarily to our emergency department along with his coach on October 1st reporting depression, thoughts of suicide, heavy use of cannabis and anxiety.  Reports feeling very depressed and anxious. He only slept 3 h last night. Feels he has let down everybody.  During conversation he becomes very tangential and it is hard to follow his train of thought.  He talked about masturbation, pornography and sexually.  He asked if he could masturbated in his room.  He also asked if he was a bad person.  Per nursing  D: Patient still appears sad and anxious. 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 Wants to talk to the doctor more about the medication he's being put on. Currently denies SI/HI/AVH.Interacting well with peers on the floor. Patient heard crying in room. A: Medication given with education. Encouragement provided.  R: Patient has remained calm and cooperative on the unit. Safety maintained with 15 min checks.    Principal Problem: Major depressive disorder, single episode, severe with psychotic features (HCC) Diagnosis:   Patient Active Problem List   Diagnosis Date Noted  . Cannabis use disorder, severe, dependence (HCC) [F12.20] 01/10/2016  . Major depressive disorder, single episode, severe with psychotic features (HCC) [F32.3] 01/10/2016   Total Time spent with patient: 30 minutes  Past Psychiatric History:   Past Medical History: History reviewed. No pertinent past medical history. History reviewed. No pertinent surgical history. Family History: History reviewed. No pertinent family history. Family Psychiatric  History:  Social History:  History  Alcohol use Not on file     History  Drug Use  . Types: Marijuana    Social  History   Social History  . Marital status: Single    Spouse name: N/A  . Number of children: N/A  . Years of education: N/A   Social History Main Topics  . Smoking status: Never Smoker  . Smokeless tobacco: Never Used  . Alcohol use None  . Drug use:     Types: Marijuana  . Sexual activity: Not Asked   Other Topics Concern  . None   Social History Narrative  . None     Current Medications: Current Facility-Administered Medications  Medication Dose Route Frequency Provider Last Rate Last Dose  . acetaminophen (TYLENOL) tablet 650 mg  650 mg Oral Q6H PRN Audery AmelJohn T Clapacs, MD      . alum & mag hydroxide-simeth (MAALOX/MYLANTA) 200-200-20 MG/5ML suspension 30 mL  30 mL Oral Q4H PRN Audery AmelJohn T Clapacs, MD      . FLUoxetine (PROZAC) capsule 10 mg  10 mg Oral Daily Jimmy FootmanAndrea Hernandez-Gonzalez, MD   10 mg at 01/11/16 0849  . magnesium hydroxide (MILK OF MAGNESIA) suspension 30 mL  30 mL Oral Daily PRN Audery AmelJohn T Clapacs, MD      . risperiDONE (RISPERDAL) tablet 0.5 mg  0.5 mg Oral TID Jimmy FootmanAndrea Hernandez-Gonzalez, MD   0.5 mg at 01/11/16 0850  . traZODone (DESYREL) tablet 50 mg  50 mg Oral QHS Jimmy FootmanAndrea Hernandez-Gonzalez, MD   50 mg at 01/10/16 2245    Lab Results:  Results for orders placed or performed during the hospital encounter of 01/09/16 (from the past 48 hour(s))  Hemoglobin A1c     Status: None   Collection Time:  01/10/16  6:39 AM  Result Value Ref Range   Hgb A1c MFr Bld 5.3 4.8 - 5.6 %    Comment: (NOTE)         Pre-diabetes: 5.7 - 6.4         Diabetes: >6.4         Glycemic control for adults with diabetes: <7.0    Mean Plasma Glucose 105 mg/dL    Comment: (NOTE) Performed At: [facility] [address] [place], [place] [id number] Mila Homer MD WU:[id number]   Lipid panel     Status: None   Collection Time: 01/10/16  6:39 AM  Result Value Ref Range   Cholesterol 121 0 - 200 mg/dL   Triglycerides 53 <956 mg/dL   HDL 67 >21 mg/dL   Total CHOL/HDL Ratio 1.8  RATIO   VLDL 11 0 - 40 mg/dL   LDL Cholesterol 43 0 - 99 mg/dL    Comment:        Total Cholesterol/HDL:CHD Risk Coronary Heart Disease Risk Table                     Men   Women  1/2 Average Risk   3.4   3.3  Average Risk       5.0   4.4  2 X Average Risk   9.6   7.1  3 X Average Risk  23.4   11.0        Use the calculated Patient Ratio above and the CHD Risk Table to determine the patient's CHD Risk.        ATP III CLASSIFICATION (LDL):  <100     mg/dL   Optimal  308-657  mg/dL   Near or Above                    Optimal  130-159  mg/dL   Borderline  846-962  mg/dL   High  >952     mg/dL   Very High   Prolactin     Status: Abnormal   Collection Time: 01/10/16  6:39 AM  Result Value Ref Range   Prolactin 19.1 (H) 4.0 - 15.2 ng/mL    Comment: (NOTE) Performed At: [facility] [address] [place], [place] [id number] Mila Homer MD UU:[id number]   TSH     Status: None   Collection Time: 01/10/16  6:39 AM  Result Value Ref Range   TSH 2.517 0.350 - 4.500 uIU/mL  Vitamin B12     Status: None   Collection Time: 01/10/16  6:39 AM  Result Value Ref Range   Vitamin B-12 851 180 - 914 pg/mL    Comment: (NOTE) This assay is not validated for testing neonatal or myeloproliferative syndrome specimens for Vitamin B12 levels. Performed at [facility]     Blood Alcohol level:  Lab Results  Component Value Date   [facility] <5 01/08/2016    Metabolic Disorder Labs: Lab Results  Component Value Date   HGBA1C 5.3 01/10/2016   MPG 105 01/10/2016   Lab Results  Component Value Date   PROLACTIN 19.1 (H) 01/10/2016   Lab Results  Component Value Date   CHOL 121 01/10/2016   TRIG 53 01/10/2016   HDL 67 01/10/2016   CHOLHDL 1.8 01/10/2016   VLDL 11 01/10/2016   LDLCALC 43 01/10/2016    Physical Findings: AIMS:  , ,  ,  ,    CIWA:    COWS:  Musculoskeletal: Strength & Muscle Tone: within normal limits Gait & Station: normal Patient leans:  N/A  Psychiatric Specialty Exam: Physical Exam  Constitutional: He is oriented to person, place, and time. He appears well-developed and well-nourished.  HENT:  Head: Normocephalic and atraumatic.  Eyes: Conjunctivae and EOM are normal.  Neck: Normal range of motion.  Musculoskeletal: Normal range of motion.  Neurological: He is alert and oriented to person, place, and time.    Review of Systems  Constitutional: Negative.   HENT: Negative.   Eyes: Negative.   Respiratory: Negative.   Cardiovascular: Negative.   Gastrointestinal: Negative.   Genitourinary: Negative.   Musculoskeletal: Negative.   Skin: Negative.   Neurological: Negative.   Endo/Heme/Allergies: Negative.   Psychiatric/Behavioral: Positive for depression and substance abuse. Negative for hallucinations, memory loss and suicidal ideas. The patient is nervous/anxious and has insomnia.     Blood pressure 134/81, pulse 77, temperature 98.4 F (36.9 C), temperature source Oral, resp. rate 16, height 5\' 10"  (1.778 m), weight 73 kg (161 lb), SpO2 100 %.Body mass index is 23.1 kg/m.  General Appearance: Fairly Groomed  Eye Contact:  Fair  Speech:  Clear and Coherent  Volume:  Decreased  Mood:  Dysphoric  Affect:  Blunt  Thought Process:  Disorganized and Descriptions of Associations: Tangential  Orientation:  Full (Time, Place, and Person)  Thought Content:  Delusions and Tangential  Suicidal Thoughts:  Yes.  without intent/plan  Homicidal Thoughts:  No  Memory:  Immediate;   Fair Recent;   Fair Remote;   Fair  Judgement:  Poor  Insight:  Shallow  Psychomotor Activity:  Decreased  Concentration:  Concentration: Poor and Attention Span: Poor  Recall:  Fair  Fund of Knowledge:  Good  Language:  Good  Akathisia:  No  Handed:    AIMS (if indicated):     Assets:  Communication Skills Desire for Improvement Financial Resources/Insurance Housing Leisure Time Physical Health Social  Support Talents/Skills Vocational/Educational  ADL's:  Intact  Cognition:  Impaired,  Mild  Sleep:  Number of Hours: 3     Treatment Plan Summary: Not improvement noted yet.  Major depressive disorder with psychotic features: Appears at this point in time the patient might be having some delusions about the safety of his mother and sister. He asked me twice during initial assessment if his mother was dead or alive. Also his thought process appears to be disorganized at times pointing to a diagnosis of major depressive disorder with psychotic features. He has been  started on fluoxetine 10 mg by mouth daily and Risperdal 0.5 mg by mouth 3 times a day. Will increase risperdal to 1 mg bid  For insomnia: pt only slept 3 h last night.  Will d/c trazodone and instead order ativan 1 mg qhs  Cannabis use disorder severe: Patient is in need of intensive outpatient substance abuse treatment  Labs: TSH is within the normal limits. Urine toxicology only positive for cannabis. Alcohol level was below the detection limit CBC and compressive metabolic panel are within normal limits. Vit b12 was wnl  Precautions every 15 minute checks  Diet regular  Hospitalization and status continue involuntary commitment  Disposition: Once a stable he will be returning back to [facility]  Follow-up he will need to be a scheduled with a psychiatrist and he will need referral to substance abuse treatment  CSW talked with Pt's tennis Coach from [employer], [phone] who describes Pt mood as worsening 5 days ago when  he came to captain and himself describing regrets of marijuana use and told them about his difficulty with feelings of depression, unworthiness, guilt, low self-esteem.  Coach and others say they were unaware of his use.  He describes that they were going to get him hooked up with outpatient counseling at [facility] but that things worsened and he seemed to be having panic attack  and very overwhelmed so he brought him to the hospital.  Angela Burke, Director of Counseling, [place] [phone] contacted [facility] to report that [facility] will be following pt's case once discharged, but wanted hospital to be aware that there is no Psychiatrist on staff with [facility], and requests/recommends that IF outpatient therapy is ordered on discharge that pt be referred to another source for psychiatric care.   Jimmy Footman, MD 01/11/2016, 9:14 AM

## 2016-01-10 NOTE — [facility] Suicide Risk Assessment (Signed)
[facility] INPATIENT:  Family/Significant Other Suicide Prevention Education  Suicide Prevention Education:  Education Completed; Tennis Coach, Wendall StadeMichael Leonard,  (name of family member/significant other) has been identified by the patient as the family member/significant other with whom the patient will be residing, and identified as the person(s) who will aid the patient in the event of a mental health crisis (suicidal ideations/suicide attempt).  With written consent from the patient, the family member/significant other has been provided the following suicide prevention education, prior to the and/or following the discharge of the patient.  The suicide prevention education provided includes the following:  Suicide risk factors  Suicide prevention and interventions  National Suicide Hotline telephone number  [facility] assessment telephone number  [facility] Emergency Assistance 911  [facility] and/or Residential Mobile Crisis Unit telephone number  Request made of family/significant other to:  Remove weapons (e.g., guns, rifles, knives), all items previously/currently identified as safety concern.    Remove drugs/medications (over-the-counter, prescriptions, illicit drugs), all items previously/currently identified as a safety concern.  The family member/significant other verbalizes understanding of the suicide prevention education information provided.  The family member/significant other agrees to remove the items of safety concern listed above.  Glennon MacSara P Loucinda Croy, MSW, LCSW 01/10/2016, 5:10 PM

## 2016-01-10 NOTE — Progress Notes (Signed)
Patient ID: Dillon Shannon, male   DOB: 1996-01-14, 20 y.o.   MRN: [id number] CSW talked with Pt's tennis Coach from [employer], Michael Leonard, [phone] who describes Pt mood as worsening 5 days ago when he came to captain and himself describing regrets of marijuana use and told them about his difficulty with feelings of depression, unworthiness, guilt, low self-esteem.  Coach and others say they were unaware of his use.  He describes that they were going [facility] but that things worsened and he seemed to be having panic attack and very overwhelmed so he brought him to the hospital.  CSW direct him to proper visiting times and phone hours for the [facility].  He plans to visit this evening.  He has not noticed any bizarre or strange questions/behaviors from the patient nor has any been reported by other team members.  Has no other concerns or questions other than being updated on Pt progress and follow up plan. He verbalizes having a compassion for him and what he is going through and expects that he and the team will be available to assist with his recovery.  Dillon SharkSara Braylon Lemmons, LCSW

## 2016-01-10 NOTE — Progress Notes (Signed)
D:  Patient quiet and reserved. Avertive eye contact.  Denies Si/HI/AVH.  Stays to self.  No group attendance.  At times has difficulty expressing self.  Inappropriate laughter noted at times.  Unable to verbalize coping skills.  Support and encouragement offered.  Safety maintained.

## 2016-01-10 NOTE — Progress Notes (Signed)
D: Patient appears very depressed. States he's guilty about the way he's acted at school and he realizes he's been suppressing some things from the past. Currently denies SI/HI/AVH. States he wants to "make things right" with his family. Interacting well with peers on the floor.  A: No nightly medication given. Encouragement provided.  R: Patient has remained calm and cooperative on the unit. Safety maintained with 15 min checks.

## 2016-01-10 NOTE — [facility] Group Notes (Signed)
[facility] Group Notes:  (Nursing/MHT/Case Management/Adjunct)  Date:  01/10/2016  Time:  9:28 PM  Type of Therapy:  Psychoeducational Skills  Participation Level:  Active  Participation Quality:  Appropriate  Affect:  Appropriate  Cognitive:  Appropriate  Insight:  Appropriate and Good  Engagement in Group:  Engaged  Modes of Intervention:  Discussion, Socialization and Support  Summary of Progress/Problems:  Chancy MilroyLaquanda Y Emiline Mancebo 01/10/2016, 9:28 PM

## 2016-01-11 LAB — HEMOGLOBIN A1C
Hgb A1c MFr Bld: 5.3 % (ref 4.8–5.6)
MEAN PLASMA GLUCOSE: 105 mg/dL

## 2016-01-11 LAB — VITAMIN B12: Vitamin B-12: 851 pg/mL (ref 180–914)

## 2016-01-11 LAB — PROLACTIN: PROLACTIN: 19.1 ng/mL — AB (ref 4.0–15.2)

## 2016-01-11 MED ORDER — LORAZEPAM 1 MG PO TABS
1.0000 mg | ORAL_TABLET | Freq: Every day | ORAL | Status: DC
  Administered 2016-01-11: 1 mg via ORAL
  Filled 2016-01-11: qty 1

## 2016-01-11 MED ORDER — RISPERIDONE 1 MG PO TABS
1.0000 mg | ORAL_TABLET | Freq: Two times a day (BID) | ORAL | Status: DC
  Administered 2016-01-11 – 2016-01-14 (×6): 1 mg via ORAL
  Filled 2016-01-11 (×6): qty 1

## 2016-01-11 MED ORDER — RISPERIDONE 1 MG PO TABS: 0.5000 mg | ORAL_TABLET | Freq: Three times a day (TID) | ORAL | Status: AC

## 2016-01-11 NOTE — Plan of Care (Signed)
Problem: Safety: Goal: Ability to remain free from injury will improve Outcome: Progressing Patient has remained free from injury during this shift.   

## 2016-01-11 NOTE — Progress Notes (Signed)
D: Patient still appears sad and anxious. 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 Wants to talk to the doctor more about the medication he's being put on. Currently denies SI/HI/AVH.Interacting well with peers on the floor. Patient heard crying in room. A: Medication given with education. Encouragement provided.  R: Patient has remained calm and cooperative on the unit. Safety maintained with 15 min checks.

## 2016-01-11 NOTE — Progress Notes (Signed)
D:  Patient continues to be sad and display flat affect.  Patient has constantly had questions and concerns about masturbating throughout the day.  Patient has been refusing medication because he feels that the medication is not allowing him to get an erection. A:  Patient encouraged to attend group sessions.  Patient offered support and encouragement. R:  Patient safety maintained with 15 minute checks.

## 2016-01-11 NOTE — Tx Team (Signed)
Interdisciplinary Treatment and Diagnostic Plan Update  01/11/2016 Time of Session: 11:00am Cathie OldenSalvador Zimmermann MRN: [id number]  Principal Diagnosis: Major depressive disorder, single episode, severe with psychotic features (HCC)  Secondary Diagnoses: Principal Problem:   Major depressive disorder, single episode, severe with psychotic features (HCC) Active Problems:   Cannabis use disorder, severe, dependence (HCC)   Current Medications:  Current Facility-Administered Medications  Medication Dose Route Frequency Provider Last Rate Last Dose  . acetaminophen (TYLENOL) tablet 650 mg  650 mg Oral Q6H PRN Audery AmelJohn T Clapacs, MD      . alum & mag hydroxide-simeth (MAALOX/MYLANTA) 200-200-20 MG/5ML suspension 30 mL  30 mL Oral Q4H PRN Audery AmelJohn T Clapacs, MD      . FLUoxetine (PROZAC) capsule 10 mg  10 mg Oral Daily Jimmy FootmanAndrea Hernandez-Gonzalez, MD   10 mg at 01/11/16 0849  . LORazepam (ATIVAN) tablet 1 mg  1 mg Oral QHS Jimmy FootmanAndrea Hernandez-Gonzalez, MD      . magnesium hydroxide (MILK OF MAGNESIA) suspension 30 mL  30 mL Oral Daily PRN Audery AmelJohn T Clapacs, MD      . risperiDONE (RISPERDAL) tablet 0.5 mg  0.5 mg Oral TID Jimmy FootmanAndrea Hernandez-Gonzalez, MD      . risperiDONE (RISPERDAL) tablet 1 mg  1 mg Oral BID Jimmy FootmanAndrea Hernandez-Gonzalez, MD       PTA Medications: No prescriptions prior to admission.    Patient Stressors: Educational concerns Substance abuse  Patient Strengths: Capable of independent living Wellsite geologistCommunication skills General fund of knowledge Motivation for treatment/growth  Treatment Modalities: Medication Management, Group therapy, Case management,  1 to 1 session with clinician, Psychoeducation, Recreational therapy.   Physician Treatment Plan for Primary Diagnosis: Major depressive disorder, single episode, severe with psychotic features (HCC) Long Term Goal(s): Improvement in symptoms so as ready for discharge  Short Term Goals: Ability to identify changes in lifestyle to reduce recurrence  of condition will improve, Ability to verbalize feelings will improve, Ability to disclose and discuss suicidal ideas, Ability to demonstrate self-control will improve, Ability to identify and develop effective coping behaviors will improve, and Ability to identify triggers associated with substance abuse/mental health issues will improve  Medication Management: Evaluate patient's response, side effects, and tolerance of medication regimen.  Therapeutic Interventions: 1 to 1 sessions, Unit Group sessions and Medication administration.  Evaluation of Outcomes: Progressing  Physician Treatment Plan for Secondary Diagnosis: Principal Problem:   Major depressive disorder, single episode, severe with psychotic features (HCC) Active Problems:   Cannabis use disorder, severe, dependence (HCC)  Long Term Goal(s): Improvement in symptoms so as ready for discharge   Short Term Goals: Ability to identify changes in lifestyle to reduce recurrence of condition will improve, Ability to demonstrate self-control will improve, Ability to identify and develop effective coping behaviors will improve, and Ability to identify triggers associated with substance abuse/mental health issues will improve     Medication Management: Evaluate patient's response, side effects, and tolerance of medication regimen.  Therapeutic Interventions: 1 to 1 sessions, Unit Group sessions and Medication administration.  Evaluation of Outcomes: Progressing   RN Treatment Plan for Primary Diagnosis: Major depressive disorder, single episode, severe with psychotic features (HCC) Long Term Goal(s): Knowledge of disease and therapeutic regimen to maintain health will improve  Short Term Goals: Ability to participate in decision making will improve, Ability to verbalize feelings will improve, Ability to disclose and discuss suicidal ideas, Ability to identify and develop effective coping behaviors will improve and Compliance with  prescribed medications will improve  Medication Management: RN  will administer medications as ordered by provider, will assess and evaluate patient's response and provide education to patient for prescribed medication. RN will report any adverse and/or side effects to prescribing provider.  Therapeutic Interventions: 1 on 1 counseling sessions, Psychoeducation, Medication administration, Evaluate responses to treatment, Monitor vital signs and CBGs as ordered, Perform/monitor CIWA, COWS, AIMS and Fall Risk screenings as ordered, Perform wound care treatments as ordered.  Evaluation of Outcomes: Progressing   LCSW Treatment Plan for Primary Diagnosis: Major depressive disorder, single episode, severe with psychotic features (HCC) Long Term Goal(s): Safe transition to appropriate next level of care at discharge, Engage patient in therapeutic group addressing interpersonal concerns.  Short Term Goals: Engage patient in aftercare planning with referrals and resources, Increase social support, Increase emotional regulation, Facilitate acceptance of mental health diagnosis and concerns, Facilitate patient progression through stages of change regarding substance use diagnoses and concerns, Identify triggers associated with mental health/substance abuse issues and Increase skills for wellness and recovery  Therapeutic Interventions: Assess for all discharge needs, 1 to 1 time with Social worker, Explore available resources and support systems, Assess for adequacy in community support network, Educate family and significant other(s) on suicide prevention, Complete Psychosocial Assessment, Interpersonal group therapy.  Evaluation of Outcomes: Progressing   Progress in Treatment: Attending groups: No. Participating in groups: No. Taking medication as prescribed: Yes. Toleration medication: Yes. Family/Significant other contact made: Yes, individual(s) contacted:  Athletic Coach Patient understands  diagnosis: Yes. Discussing patient identified problems/goals with staff: Yes. Medical problems stabilized or resolved: Yes. Denies suicidal/homicidal ideation: No. Issues/concerns per patient self-inventory: No. Other: n/a  New problem(s) identified: None identified at this time.   New Short Term/Long Term Goal(s): None identified at this time.   Discharge Plan or Barriers: Patient will discharge back to school and follow-up with [facility] for therapy and medication management.   Reason for Continuation of Hospitalization: Depression Medication stabilization Suicidal ideation  Estimated Length of Stay: 5 to 7 days  Attendees: Patient:Dillon Shannon 01/11/2016 2:46 PM  Physician: Dr. Radene JourneyJayme Cloud, MD 01/11/2016 2:46 PM  Nursing: Elenore Paddy, RN 01/11/2016 2:46 PM  RN Care Manager: 01/11/2016 2:46 PM  Social Worker: Fredrich Birks. Garnette Czech MSW, LCSWA 01/11/2016 2:46 PM  Recreational Therapist:  01/11/2016 2:46 PM  Other:  01/11/2016 2:46 PM  Other:  01/11/2016 2:46 PM  Other: 01/11/2016 2:46 PM    Scribe for Treatment Team: Arelia Longest, LCSWA 01/11/2016 2:52 PM

## 2016-01-11 NOTE — [facility] Group Notes (Signed)
[facility] LCSW Group Therapy   01/11/2016  9:30am  Type of Therapy: Group Therapy   Participation Level: Did Not Attend. Patient invited to participate but declined.    Dillon Shannon, MSW, LCSWA, LCAS

## 2016-01-12 MED ORDER — LORAZEPAM 2 MG PO TABS
2.0000 mg | ORAL_TABLET | Freq: Every day | ORAL | Status: DC
  Administered 2016-01-12 – 2016-01-15 (×4): 2 mg via ORAL
  Filled 2016-01-12 (×4): qty 1

## 2016-01-12 NOTE — [facility] Group Notes (Deleted)
Goals Group  Date/Time: 01/12/2016 9am  Type of Therapy and Topic: Group Therapy: Goals Group: SMART Goals   Pt was called, but did not attend   Alegandro Macnaughton F. Shade Rivenbark, LCSWA, LCAS  

## 2016-01-12 NOTE — [facility] Group Notes (Signed)
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 Pt seemed brighter than staff had previously observed.  Dillon Shannon 01/12/2016, 2:36 AM

## 2016-01-12 NOTE — [facility] Group Notes (Signed)
[facility] Group Notes:  (Nursing/MHT/Case Management/Adjunct)  Date:  01/12/2016  Time:  3:59 PM  Type of Therapy:  Psychoeducational Skills  Participation Level:  Active  Participation Quality:  Appropriate and Sharing  Affect:  Appropriate  Cognitive:  Appropriate  Insight:  Appropriate  Engagement in Group:  Engaged and Supportive  Modes of Intervention:  Discussion, Education and Support  Summary of Progress/Problems:  Dillon PennerKristen J Salia Shannon 01/12/2016, 3:59 PM

## 2016-01-12 NOTE — [facility] Group Notes (Signed)
[facility] Group Notes:  (Nursing/MHT/Case Management/Adjunct)  Date:  01/12/2016  Time:  11:14 PM  Type of Therapy:  Psychoeducational Skills  Participation Level:  Active  Participation Quality:  Appropriate, Attentive and Sharing  Affect:  Appropriate  Cognitive:  Appropriate  Insight:  Appropriate and Good  Engagement in Group:  Engaged  Modes of Intervention:  Discussion, Socialization and Support  Summary of Progress/Problems:  Dillon Shannon 01/12/2016, 11:14 PM

## 2016-01-12 NOTE — Plan of Care (Signed)
Problem: Self-Concept: Goal: Ability to verbalize positive feelings about self will improve Outcome: Progressing Pt talked about being more introspective and motivated to progress in treatment.

## 2016-01-12 NOTE — Progress Notes (Signed)
Patient rated his depression & anxiety 3/10.Denies suicidal or homicidal ideations & AV hallucinations.Patient stated that he is guilty about his behaviors & trying to stay away from drugs & alcohol.Attended groups.Compliant with medications.Appropriate with staff & peers.Appetite & energy level good.

## 2016-01-12 NOTE — Progress Notes (Signed)
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 Patient came in voluntarily to our emergency department along with his coach on October 1st reporting depression, thoughts of suicide, heavy use of cannabis and anxiety.  Patient reports feeling better today. His thought process. More organized and linear than he has been before. He only slept 4 hours last night despite taking Ativan and a higher dose of Risperdal.  Patient denies having suicidality, homicidality or auditory or visual hallucinations. He denies having any side effects from his medications and denies having any physical complaints.  Assets yesterday the patient was quite disorganized. He was hyper focused on  Sexuality, masturbation, pornography .  Constantly preoccupied with not being able to have erections.  He felt overwhelmed and guilty about his behaviors and actions prior to admission.    Per nursing D: Observed pt in dayroom interacting with peers. Patient alert and oriented x4. Patient denies SI/HI/AVH. Pt affect anxious and blunted. Pt talked about having been "too selfish" in the past and that his time here has allowed in to reflect on his actions. Pt said he has received good support from his team and his coach. Pt endorsed wanting to go back to school and stated "I'll never do that again" in regards to drugs. Pt denies expreincing delusions. Pt had questions about medications. Pt discussed being worried he could not get an erection today.  A: Offered active listening and support. Provided therapeutic communication. Administered scheduled medications. Educated pt on current medications. Discussed side effects of medications, and suggested pt report side effects to doctor. R: Pt pleasant and cooperative. Pt medication compliant. Will continue Q15 min. checks.  Safety maintained.  Principal Problem: Major depressive disorder, single episode, severe with psychotic features (HCC) Diagnosis:   Patient Active Problem List   Diagnosis Date Noted  . Cannabis use disorder, severe, dependence (HCC) [F12.20] 01/10/2016  . Major depressive disorder, single episode, severe with psychotic features (HCC) [F32.3] 01/10/2016   Total Time spent with patient: 30 minutes  Past Psychiatric History:   Past Medical History: History reviewed. No pertinent past medical history. History reviewed. No pertinent surgical history. Family History: History reviewed. No pertinent family history. Family Psychiatric  History:  Social History:  History  Alcohol use Not on file     History  Drug Use  . Types: Marijuana    Social History   Social History  . Marital status: Single    Spouse name: N/A  . Number of children: N/A  . Years of education: N/A   Social History Main Topics  . Smoking status: Never Smoker  . Smokeless tobacco: Never Used  . Alcohol use None  . Drug use:     Types: Marijuana  . Sexual activity: Not Asked   Other Topics Concern  . None   Social History Narrative  . None     Current Medications: Current Facility-Administered Medications  Medication Dose Route Frequency Provider Last Rate Last Dose  . acetaminophen (TYLENOL) tablet 650 mg  650 mg Oral Q6H PRN Audery Amel, MD      . alum & mag hydroxide-simeth (MAALOX/MYLANTA) 200-200-20 MG/5ML suspension 30 mL  30 mL Oral Q4H PRN Audery Amel, MD      . FLUoxetine (PROZAC) capsule 10 mg  10 mg Oral Daily Jimmy Footman, MD   10  mg at 01/12/16 0858  . LORazepam (ATIVAN) tablet 1 mg  1 mg Oral QHS Jimmy FootmanAndrea Hernandez-Gonzalez, MD   1 mg at 01/11/16 2231  . magnesium hydroxide (MILK OF MAGNESIA) suspension 30 mL  30 mL Oral Daily PRN Audery AmelJohn T Clapacs, MD      . risperiDONE (RISPERDAL) tablet 1 mg  1 mg Oral BID Jimmy FootmanAndrea Hernandez-Gonzalez, MD   1 mg at 01/12/16 [id number]    Lab  Results:  No results found for this or any previous visit (from the past 48 hour(s)).  Blood Alcohol level:  Lab Results  Component Value Date   ETH <5 01/08/2016    Metabolic Disorder Labs: Lab Results  Component Value Date   HGBA1C 5.3 01/10/2016   MPG 105 01/10/2016   Lab Results  Component Value Date   PROLACTIN 19.1 (H) 01/10/2016   Lab Results  Component Value Date   CHOL 121 01/10/2016   TRIG 53 01/10/2016   HDL 67 01/10/2016   CHOLHDL 1.8 01/10/2016   VLDL 11 01/10/2016   LDLCALC 43 01/10/2016    Physical Findings: AIMS:  , ,  ,  ,    CIWA:    COWS:     Musculoskeletal: Strength & Muscle Tone: within normal limits Gait & Station: normal Patient leans: N/A  Psychiatric Specialty Exam: Physical Exam  Constitutional: He is oriented to person, place, and time. He appears well-developed and well-nourished.  HENT:  Head: Normocephalic and atraumatic.  Eyes: Conjunctivae and EOM are normal.  Neck: Normal range of motion.  Musculoskeletal: Normal range of motion.  Neurological: He is alert and oriented to person, place, and time.    Review of Systems  Constitutional: Negative.   HENT: Negative.   Eyes: Negative.   Respiratory: Negative.   Cardiovascular: Negative.   Gastrointestinal: Negative.   Genitourinary: Negative.   Musculoskeletal: Negative.   Skin: Negative.   Neurological: Negative.   Endo/Heme/Allergies: Negative.   Psychiatric/Behavioral: Positive for depression and substance abuse. Negative for hallucinations, memory loss and suicidal ideas. The patient is nervous/anxious and has insomnia.     Blood pressure 129/78, pulse 81, temperature 98.4 F (36.9 C), temperature source Oral, resp. rate 20, height 5\' 10"  (1.778 m), weight 73 kg (161 lb), SpO2 100 %.Body mass index is 23.1 kg/m.  General Appearance: Fairly Groomed  Eye Contact:  Fair  Speech:  Clear and Coherent  Volume:  Decreased  Mood:  Dysphoric  Affect:  Blunt  Thought  Process:  Disorganized and Descriptions of Associations: Tangential  Orientation:  Full (Time, Place, and Person)  Thought Content:  Delusions and Tangential  Suicidal Thoughts:  Yes.  without intent/plan  Homicidal Thoughts:  No  Memory:  Immediate;   Fair Recent;   Fair Remote;   Fair  Judgement:  Poor  Insight:  Shallow  Psychomotor Activity:  Decreased  Concentration:  Concentration: Poor and Attention Span: Poor  Recall:  Fair  Fund of Knowledge:  Good  Language:  Good  Akathisia:  No  Handed:    AIMS (if indicated):     Assets:  Communication Skills Desire for Improvement Financial Resources/Insurance Housing Leisure Time Physical Health Social Support Talents/Skills Vocational/Educational  ADL's:  Intact  Cognition:  Impaired,  Mild  Sleep:  Number of Hours: 4.5     Treatment Plan Summary: Not improvement noted yet.  Major depressive disorder with psychotic features: Appears at this point in time the patient might be having some delusions about the safety of his mother and  sister. He asked me twice during initial assessment if his mother was dead or alive. Also his thought process appears to be disorganized at times pointing to a diagnosis of major depressive disorder with psychotic features. He has been  started on fluoxetine 10 mg by mouth daily and Risperdal.  Risperdal has been increased to 1 mg po bid  For insomnia: pt only slept 4 h last night and 3 h the night before.  Will  Increase ativan to 2 mg po qhs  Cannabis use disorder severe: Patient is in need of intensive outpatient substance abuse treatment  Labs: TSH is within the normal limits. Urine toxicology only positive for cannabis. Alcohol level was below the detection limit CBC and compressive metabolic panel are within normal limits. Vit b12 was wnl  Precautions every 15 minute checks  Diet regular  Hospitalization and status continue involuntary commitment  Disposition: Once a stable he  will be returning back to [facility]  Follow-up he will need to be a scheduled with a psychiatrist and he will need referral to substance abuse treatment  Allene Dillon Child psychotherapist and I spoke with [employer] tennis Coach from [place], [phone].  Coaches concerned about how the patient will afford care after discharge as he has no financial help from family members. He feels the patient seems to be improving.  We discussed possibility of discharge in the next 3-5 days.  Angela Burke, Director of Counseling, [facility] [phone] contacted [facility] to report that [facility] will be following pt's case once discharged, but wanted hospital to be aware that there is no Psychiatrist on staff with [facility], and requests/recommends that IF outpatient therapy is ordered on discharge that pt be referred to another source for psychiatric care.   Jimmy Footman, MD 01/12/2016, 12:51 PM

## 2016-01-12 NOTE — Progress Notes (Signed)
D: Observed pt in dayroom interacting with peers. Patient alert and oriented x4. Patient denies SI/HI/AVH. Pt affect anxious and blunted. Pt talked about having been "too selfish" in the past and that his time here has allowed in to reflect on his actions. Pt said he has received good support from his team and his coach. Pt endorsed wanting to go back to school and stated "I'll never do that again" in regards to drugs. Pt denies expreincing delusions. Pt had questions about medications. Pt discussed being worried he could not get an erection today.  A: Offered active listening and support. Provided therapeutic communication. Administered scheduled medications. Educated pt on current medications. Discussed side effects of medications, and suggested pt report side effects to doctor. R: Pt pleasant and cooperative. Pt medication compliant. Will continue Q15 min. checks. Safety maintained.

## 2016-01-12 NOTE — [facility] Group Notes (Signed)
Goals Group Date/Time: 01/12/2016 9:00 AM Type of Therapy and Topic: Group Therapy: Goals Group: SMART Goals   Participation Level: Moderate  Description of Group:    The purpose of a daily goals group is to assist and guide patients in setting recovery/wellness-related goals. The objective is to set goals as they relate to the crisis in which they were admitted. Patients will be using SMART goal modalities to set measurable goals. Characteristics of realistic goals will be discussed and patients will be assisted in setting and processing how one will reach their goal. Facilitator will also assist patients in applying interventions and coping skills learned in psycho-education groups to the SMART goal and process how one will achieve defined goal.   Therapeutic Goals:   -Patients will develop and document one goal related to or their crisis in which brought them into treatment.  -Patients will be guided by LCSW using SMART goal setting modality in how to set a measurable, attainable, realistic and time sensitive goal.  -Patients will process barriers in reaching goal.  -Patients will process interventions in how to overcome and successful in reaching goal.   Patient's Goal: Pt shared the pt tries to"stay focused" in order to achieve the pt's primary goal [facility]. Pt shared that maintaining his life without using drugs for long periods of time was a barrier in achieving the pt's goal.  Pt was polite and cooperative with the CSW and other group members and focused and attentive to the topics discussed and the sharing of others.   Therapeutic Modalities:  Motivational Interviewing  Research officer, political partyCognitive Behavioral Therapy  Crisis Intervention Model  SMART goals setting   Dorothe PeaJonathan F. Sehaj Mcenroe, LCSWA, LCAS

## 2016-01-12 NOTE — [facility] Group Notes (Signed)
[facility] LCSW Group Therapy   01/12/2016 9:30 am   Type of Therapy: Group Therapy   Participation Level: Active   Participation Quality: Attentive, Sharing and Supportive   Affect: Appropriate   Cognitive: Alert and Oriented   Insight: Developing/Improving and Engaged   Engagement in Therapy: Developing/Improving and Engaged   Modes of Intervention: Clarification, Confrontation, Discussion, Education, Exploration, Limit-setting, Orientation, Problem-solving, Rapport Building, Dance movement psychotherapisteality Testing, Socialization and Support   Summary of Progress/Problems: The topic for group was balance in life. Today's group focused on defining balance in one's own words, identifying things that can knock one off balance, and exploring healthy ways to maintain balance in life. Group members were asked to provide an example of a time when they felt off balance, describe how they handled that situation, and process healthier ways to regain balance in the future. Group members were asked to share the most important tool for maintaining balance that they [facility] and how they plan to apply this method after discharge.  Pt entered group, sat down and presented as pleasant and clam.  Pt responded to the CSW's questioning the pt on how the pt was doing by just saying, "I'm doing okay".  Pt was asked what "feeling balanced" looked like to the pt and the pt replied that "doing athletics and playing sports".  Pt share that the pt would "turn to his teammates" in order to feel balanced and sometimes "using drugs" was a barrier to feeling balanced and that this made it more difficult to be productive for the pt. Pt was polite and cooperative with the CSW and other group members and focused and attentive to the topics discussed and the sharing of others.     Dorothe PeaJonathan F. Ailynn Shannon, LCSWA, LCAS

## 2016-01-13 NOTE — Progress Notes (Signed)
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 Patient came in voluntarily to our emergency department along with his coach on October 1st reporting depression, thoughts of suicide, heavy use of cannabis and anxiety.  Patient reports feeling better today. His thought process. More organized and linear than he has been before.   Patient denies having suicidality, homicidality or auditory or visual hallucinations. Mood is improving.  States that he feels very sedated this morning other than that no issues or SE.  Per staff he comply with all of his medications yesterday. He finally is slept last night. Staff documented he is slept 6 hours.  Wednesday the patient was quite disorganized. He was hyper focused on  Sexuality, masturbation, pornography .  Constantly preoccupied with not being able to have erections.  He felt overwhelmed and guilty about his behaviors and actions prior to admission.  Thursday asking whether or not he had a different personality when he spoke Spanish purses when he spoke [place]. Later on in the day he was again thinking that something had happened to his family he was found crying in his room.  Earlier in the admission the patient had voiced concerns that maybe his mother was dead however there is absolutely no indication that anything has happened to his mother.  I spoke with the patient's tennis coach on 10/5 and he tells me members from the tennis team who speaks Spanish had spoken with the patient's sister and mother.    Per nursing  D: Upon approach, pt is in his room crying. He reports that he is worried about his family in [place], stating "People get killed there all the time." Pt states that he has been praying for them. He denies SI/HI/AVH at this time. A: Emotional support and  encouragement provided. Pt encouraged to attempt to get in touch with family. Medications administered with education. q15 minute safety checks maintained. R: Pt remains free from harm. Will continue to monitor.  Principal Problem: Major depressive disorder, single episode, severe with psychotic features (HCC) Diagnosis:   Patient Active Problem List   Diagnosis Date Noted  . Cannabis use disorder, severe, dependence (HCC) [F12.20] 01/10/2016  . Major depressive disorder, single episode, severe with psychotic features (HCC) [F32.3] 01/10/2016   Total Time spent with patient: 30 minutes  Past Psychiatric History: Denies prior psychiatric treatment, hospitalizations, suicidal attempts or self injury  Past Medical History: History reviewed. No pertinent past medical history. History reviewed. No pertinent surgical history.   Family History: History reviewed. No pertinent family history.   Family Psychiatric  History: Patient's father was an alcoholic. He is mother has some issues with mental health as per the patient  Social History: Originally from [place]. His mother and sister live in [place]. 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  He denies having any Insurance claims handler. He denies having any legal history. He is single, never married doesn't have any children. History  Alcohol use Not on file     History  Drug Use  . Types: Marijuana    Social History   Social History  . Marital status: Single    Spouse name: N/A  . Number of children: N/A  . Years of education: N/A   Social History Main Topics  . Smoking status: Never Smoker  .  Smokeless tobacco: Never Used  . Alcohol use None  . Drug use:     Types: Marijuana  . Sexual activity: Not Asked   Other Topics Concern  . None   Social History Narrative  . None     Current Medications: Current Facility-Administered Medications  Medication Dose Route Frequency Provider Last Rate Last Dose  .  acetaminophen (TYLENOL) tablet 650 mg  650 mg Oral Q6H PRN Audery AmelJohn T Clapacs, MD      . alum & mag hydroxide-simeth (MAALOX/MYLANTA) 200-200-20 MG/5ML suspension 30 mL  30 mL Oral Q4H PRN Audery AmelJohn T Clapacs, MD      . FLUoxetine (PROZAC) capsule 10 mg  10 mg Oral Daily Jimmy FootmanAndrea Hernandez-Gonzalez, MD   10 mg at 01/13/16 0754  . LORazepam (ATIVAN) tablet 2 mg  2 mg Oral QHS Jimmy FootmanAndrea Hernandez-Gonzalez, MD   2 mg at 01/12/16 2231  . magnesium hydroxide (MILK OF MAGNESIA) suspension 30 mL  30 mL Oral Daily PRN Audery AmelJohn T Clapacs, MD      . risperiDONE (RISPERDAL) tablet 1 mg  1 mg Oral BID Jimmy FootmanAndrea Hernandez-Gonzalez, MD   1 mg at 01/13/16 0754    Lab Results:  No results found for this or any previous visit (from the past 48 hour(s)).  Blood Alcohol level:  Lab Results  Component Value Date   ETH <5 01/08/2016    Metabolic Disorder Labs: Lab Results  Component Value Date   HGBA1C 5.3 01/10/2016   MPG 105 01/10/2016   Lab Results  Component Value Date   PROLACTIN 19.1 (H) 01/10/2016   Lab Results  Component Value Date   CHOL 121 01/10/2016   TRIG 53 01/10/2016   HDL 67 01/10/2016   CHOLHDL 1.8 01/10/2016   VLDL 11 01/10/2016   LDLCALC 43 01/10/2016    Physical Findings: AIMS:  , ,  ,  ,    CIWA:    COWS:     Musculoskeletal: Strength & Muscle Tone: within normal limits Gait & Station: normal Patient leans: N/A  Psychiatric Specialty Exam: Physical Exam  Constitutional: He is oriented to person, place, and time. He appears well-developed and well-nourished.  HENT:  Head: Normocephalic and atraumatic.  Eyes: Conjunctivae and EOM are normal.  Neck: Normal range of motion.  Musculoskeletal: Normal range of motion.  Neurological: He is alert and oriented to person, place, and time.    Review of Systems  Constitutional: Negative.   HENT: Negative.   Eyes: Negative.   Respiratory: Negative.   Cardiovascular: Negative.   Gastrointestinal: Negative.   Genitourinary: Negative.    Musculoskeletal: Negative.   Skin: Negative.   Neurological: Negative.   Endo/Heme/Allergies: Negative.   Psychiatric/Behavioral: Positive for depression and substance abuse. Negative for hallucinations, memory loss and suicidal ideas. The patient is nervous/anxious. The patient does not have insomnia.     Blood pressure 139/82, pulse 85, temperature 98.2 F (36.8 C), temperature source Oral, resp. rate 12, height 5\' 10"  (1.778 m), weight 73 kg (161 lb), SpO2 98 %.Body mass index is 23.1 kg/m.  General Appearance: Fairly Groomed  Eye Contact:  Fair  Speech:  Clear and Coherent  Volume:  Decreased  Mood:  Dysphoric  Affect:  Blunt  Thought Process:  Disorganized and Descriptions of Associations: Tangential  Orientation:  Full (Time, Place, and Person)  Thought Content:  Delusions and Tangential  Suicidal Thoughts:  Yes.  without intent/plan  Homicidal Thoughts:  No  Memory:  Immediate;   Fair Recent;   Fair Remote;  Fair  Judgement:  Poor  Insight:  Shallow  Psychomotor Activity:  Decreased  Concentration:  Concentration: Poor and Attention Span: Poor  Recall:  Fair  Fund of Knowledge:  Good  Language:  Good  Akathisia:  No  Handed:    AIMS (if indicated):     Assets:  Communication Skills Desire for Improvement Financial Resources/Insurance Housing Leisure Time Physical Health Social Support Talents/Skills Vocational/Educational  ADL's:  Intact  Cognition:  Impaired,  Mild  Sleep:  Number of Hours: 6     Treatment Plan Summary:  Mild improvement noted   Major depressive disorder with psychotic features: Appears at this point in time the patient might be having some delusions about the safety of his mother and sister. He asked me twice during initial assessment if his mother was dead or alive. Also his thought process appears to be disorganized at times pointing to a diagnosis of major depressive disorder with psychotic features. He has been  started on  fluoxetine 10 mg by mouth daily and Risperdal.  Risperdal has been increased to 1 mg po bid  Patient appears to be slowly improving however at times he continues to make very odd statements which to me are evidence of a thought disorder. However his thoughts appear overall more organized and linear today. No bizarre or unusual comments during interaction  For insomnia:  Patient finally slept last night after receiving 2 mg of Ativan. Continue Ativan 2 mg by mouth daily at bedtime  Cannabis use disorder severe: Patient is in need of intensive outpatient substance abuse treatment  Labs: TSH is within the normal limits. Urine toxicology only positive for cannabis. Alcohol level was below the detection limit CBC and compressive metabolic panel are within normal limits. Vit b12 was wnl  Precautions every 15 minute checks  Diet regular  Hospitalization and status continue involuntary commitment  Disposition: Once a stable he will be returning back to [facility]  Follow-up he will need to be a scheduled with a psychiatrist and he will need referral to substance abuse treatment  Allene Dillon Child psychotherapist and I spoke with [employer] tennis Coach from [place], [phone].  Coaches concerned about how the patient will afford care after discharge as he has no financial help from family members. He feels the patient seems to be improving.  We discussed possibility of discharge early next week.  Angela Burke, Director of Counseling, [facility] [phone] contacted [facility] to report [facility] once discharged, but wanted hospital to be aware that there is no Psychiatrist on staff with [facility], and requests/recommends that IF outpatient therapy is ordered on discharge that pt be referred to another source for psychiatric care.   Jimmy Footman, MD 01/13/2016, 9:38 AM

## 2016-01-13 NOTE — Plan of Care (Signed)
Problem: Safety: Goal: Ability to remain free from injury will improve Outcome: Progressing No self harm reported or observed   

## 2016-01-13 NOTE — Plan of Care (Signed)
Problem: Coping: Goal: Ability to identify and develop effective coping behavior will improve Outcome: Progressing Pt identifies praying as a coping skill that has been helpful to him.  Problem: Safety: Goal: Ability to remain free from injury will improve Outcome: Progressing Pt remains free from harm.

## 2016-01-13 NOTE — Tx Team (Signed)
Interdisciplinary Treatment and Diagnostic Plan Update  01/13/2016 Time of Session: 10:30am Dovid Bartko MRN: [id number]  Principal Diagnosis: Major depressive disorder, single episode, severe with psychotic features (HCC)  Secondary Diagnoses: Principal Problem:   Major depressive disorder, single episode, severe with psychotic features (HCC) Active Problems:   Cannabis use disorder, severe, dependence (HCC)   Current Medications:  Current Facility-Administered Medications  Medication Dose Route Frequency Provider Last Rate Last Dose  . acetaminophen (TYLENOL) tablet 650 mg  650 mg Oral Q6H PRN Audery Amel, MD      . alum & mag hydroxide-simeth (MAALOX/MYLANTA) 200-200-20 MG/5ML suspension 30 mL  30 mL Oral Q4H PRN Audery Amel, MD      . FLUoxetine (PROZAC) capsule 10 mg  10 mg Oral Daily Jimmy Footman, MD   10 mg at 01/13/16 0754  . LORazepam (ATIVAN) tablet 2 mg  2 mg Oral QHS Jimmy Footman, MD   2 mg at 01/12/16 2231  . magnesium hydroxide (MILK OF MAGNESIA) suspension 30 mL  30 mL Oral Daily PRN Audery Amel, MD      . risperiDONE (RISPERDAL) tablet 1 mg  1 mg Oral BID Jimmy Footman, MD   1 mg at 01/13/16 1634   PTA Medications: No prescriptions prior to admission.    Patient Stressors: Educational concerns Substance abuse  Patient Strengths: Capable of independent living Wellsite geologist fund of knowledge Motivation for treatment/growth  Treatment Modalities: Medication Management, Group therapy, Case management,  1 to 1 session with clinician, Psychoeducation, Recreational therapy.   Physician Treatment Plan for Primary Diagnosis: Major depressive disorder, single episode, severe with psychotic features (HCC) Long Term Goal(s): Improvement in symptoms so as ready for discharge  Short Term Goals: Ability to identify changes in lifestyle to reduce recurrence of condition will improve, Ability to verbalize  feelings will improve, Ability to disclose and discuss suicidal ideas, Ability to demonstrate self-control will improve, Ability to identify and develop effective coping behaviors will improve, and Ability to identify triggers associated with substance abuse/mental health issues will improve  Medication Management: Evaluate patient's response, side effects, and tolerance of medication regimen.  Therapeutic Interventions: 1 to 1 sessions, Unit Group sessions and Medication administration.  Evaluation of Outcomes: Progressing  Physician Treatment Plan for Secondary Diagnosis: Principal Problem:   Major depressive disorder, single episode, severe with psychotic features (HCC) Active Problems:   Cannabis use disorder, severe, dependence (HCC)  Long Term Goal(s): Improvement in symptoms so as ready for discharge   Short Term Goals: Ability to identify changes in lifestyle to reduce recurrence of condition will improve, Ability to demonstrate self-control will improve, Ability to identify and develop effective coping behaviors will improve, and Ability to identify triggers associated with substance abuse/mental health issues will improve    Medication Management: Evaluate patient's response, side effects, and tolerance of medication regimen.  Therapeutic Interventions: 1 to 1 sessions, Unit Group sessions and Medication administration.  Evaluation of Outcomes: Progressing   RN Treatment Plan for Primary Diagnosis: Major depressive disorder, single episode, severe with psychotic features (HCC) Long Term Goal(s): Knowledge of disease and therapeutic regimen to maintain health will improve  Short Term Goals: Ability to participate in decision making will improve, Ability to verbalize feelings will improve, Ability to disclose and discuss suicidal ideas, Ability to identify and develop effective coping behaviors will improve and Compliance with prescribed medications will improve  Medication  Management: RN will administer medications as ordered by provider, will assess and evaluate patient's response and  provide education to patient for prescribed medication. RN will report any adverse and/or side effects to prescribing provider.  Therapeutic Interventions: 1 on 1 counseling sessions, Psychoeducation, Medication administration, Evaluate responses to treatment, Monitor vital signs and CBGs as ordered, Perform/monitor CIWA, COWS, AIMS and Fall Risk screenings as ordered, Perform wound care treatments as ordered.  Evaluation of Outcomes: Progressing   LCSW Treatment Plan for Primary Diagnosis: Major depressive disorder, single episode, severe with psychotic features (HCC) Long Term Goal(s): Safe transition to appropriate next level of care at discharge, Engage patient in therapeutic group addressing interpersonal concerns.  Short Term Goals: Engage patient in aftercare planning with referrals and resources, Increase social support, Increase emotional regulation, Facilitate acceptance of mental health diagnosis and concerns, Facilitate patient progression through stages of change regarding substance use diagnoses and concerns, Identify triggers associated with mental health/substance abuse issues and Increase skills for wellness and recovery  Therapeutic Interventions: Assess for all discharge needs, 1 to 1 time with Social worker, Explore available resources and support systems, Assess for adequacy in community support network, Educate family and significant other(s) on suicide prevention, Complete Psychosocial Assessment, Interpersonal group therapy.  Evaluation of Outcomes: Progressing   Progress in Treatment: Attending groups: Yes. Participating in groups: Yes. Taking medication as prescribed: Yes. Toleration medication: Yes. Family/Significant other contact made: Yes, individual(s) contacted:  Athletic Coach Patient understands diagnosis: Yes. Discussing patient identified  problems/goals with staff: Yes. Medical problems stabilized or resolved: Yes. Denies suicidal/homicidal ideation: Yes. Issues/concerns per patient self-inventory: No. Other: n/a  New problem(s) identified: None identified at this time.   New Short Term/Long Term Goal(s): None identified at this time.   Discharge Plan or Barriers: Patient will discharge back to school and [facility] and medication [facility].   Reason for Continuation of Hospitalization: Depression Medication stabilization  Estimated Length of Stay: 3 to 5 days  Attendees: Patient:Smokey Lambertson 01/13/2016 5:17 PM  Physician: Dr. Radene JourneyAndrea HernandezJayme Cloud- Gonzalez, MD 01/13/2016 5:17 PM  Nursing: Leonia ReaderPhyllis Cobb, RN 01/13/2016 5:17 PM  RN Care Manager: 01/13/2016 5:17 PM  Social Worker: Fredrich BirksAmaris G. Garnette CzechSampson MSW, LCSWA 01/13/2016 5:17 PM  Recreational Therapist:  01/13/2016 5:17 PM  Other:  01/13/2016 5:17 PM  Other:  01/13/2016 5:17 PM  Other: 01/13/2016 5:17 PM    Scribe for Treatment Team: Arelia LongestAmaris G Eschol Auxier, LCSWA 01/13/2016 5:20 PM

## 2016-01-13 NOTE — [facility] Group Notes (Signed)
[facility] LCSW Group Therapy   01/13/2016  9:30am  Type of Therapy: Group Therapy   Participation Level: Did Not Attend. Patient invited to participate but declined.    Silvia Markuson F. Kalandra Masters, MSW, LCSWA, LCAS     

## 2016-01-13 NOTE — [facility] Group Notes (Signed)
[facility] Group Notes:  (Nursing/MHT/Case Management/Adjunct)  Date:  01/13/2016  Time:  6:12 PM  Type of Therapy:  Psychoeducational Skills  Participation Level:  None   Mickey Farberamela M Moses Ellison 01/13/2016, 6:12 PM

## 2016-01-13 NOTE — Progress Notes (Signed)
D: Upon approach, pt is in his room crying. He reports that he is worried about his family in [place], stating "People get killed there all the time." Pt states that he has been praying for them. He denies SI/HI/AVH at this time. A: Emotional support and encouragement provided. Pt encouraged to attempt to get in touch with family. Medications administered with education. q15 minute safety checks maintained. R: Pt remains free from harm. Will continue to monitor.

## 2016-01-14 ENCOUNTER — Inpatient Hospital Stay: Payer: No Typology Code available for payment source

## 2016-01-14 MED ORDER — RISPERIDONE 1 MG PO TABS: 1.0000 mg | ORAL_TABLET | Freq: Two times a day (BID) | ORAL | Status: DC

## 2016-01-14 MED ORDER — RISPERIDONE 1 MG PO TABS
2.0000 mg | ORAL_TABLET | Freq: Every day | ORAL | Status: DC
Start: 2016-01-15 — End: 2016-01-14

## 2016-01-14 MED ORDER — RISPERIDONE 3 MG PO TABS
3.0000 mg | ORAL_TABLET | Freq: Every day | ORAL | Status: DC
  Administered 2016-01-14 – 2016-01-15 (×2): 3 mg via ORAL
  Filled 2016-01-14 (×2): qty 1

## 2016-01-14 NOTE — Progress Notes (Signed)
D: Pt denies SI/HI/AVH. Patient was acting bizarre this shift, he stated " I want to masturbate" and in another instance he approached staff stating " I want to have sex" patient was redirected and explained to that such behaviors was not permitted on the unit. Patient's thoughts are disorganized, speech tangential,and illogical at times. Patient is not interacting with peers and staff appropriately.  A: Pt was offered support and encouragement. Pt was given scheduled medications. Pt was encouraged to attend groups. Q 15 minute checks were done for safety.  R:Pt did not attend group. Pt is taking medication. Patient is receptive to treatment and safety maintained on unit.

## 2016-01-14 NOTE — [facility] Group Notes (Signed)
[facility] Group Notes:  (Nursing/MHT/Case Management/Adjunct)  Date:  01/14/2016  Time:  1:34 AM  Type of Therapy:  Group Therapy  Participation Level:  Active  Participation Quality:  Appropriate  Affect:  Appropriate  Cognitive:  Appropriate  Insight:  Appropriate  Engagement in Group:  Engaged  Modes of Intervention:  Discussion  Summary of Progress/Problems:  Dillon Shannon Dillon Shannon 01/14/2016, 1:34 AM

## 2016-01-14 NOTE — Progress Notes (Signed)
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 Patient came in voluntarily to our emergency department along with his coach on October 1st reporting depression, thoughts of suicide, heavy use of cannabis and anxiety.  Patient reports feeling better today. His thought process. More organized and linear than he has been before.   Patient denies having suicidality, homicidality or auditory or visual hallucinations. Mood is improving.  States that he feels very sedated this morning other than that no issues or SE.  Wednesday the patient was quite disorganized. He was hyper focused on  Sexuality, masturbation, pornography .  Constantly preoccupied with not being able to have erections.  He felt overwhelmed and guilty about his behaviors and actions prior to admission.  Thursday asking whether or not he had a different personality when he spoke Spanish purses when he spoke [place]. Later on in the day he was again thinking that something had happened to his family he was found crying in his room.  Earlier in the admission the patient had voiced concerns that maybe his mother was dead however there is absolutely no indication that anything has happened to his mother.  On Friday the patient did well during the day but then at 9 again he started talking about masturbation and wanting to have sex his thought processes were disorganized overnight. He only slept 3.  I spoke with the patient's tennis coach on 10/5 and he tells me members from the tennis team who speaks Spanish had spoken with the patient's sister and mother.    Per nursing D: Pt denies SI/HI/AVH. Patient was acting bizarre this shift, he stated " I want to masturbate" and in another instance he approached staff stating " I want to have sex" patient was redirected and  explained to that such behaviors was not permitted on the unit. Patient's thoughts are disorganized, speech tangential,and illogical at times. Patient is not interacting with peers and staff appropriately.  A: Pt was offered support and encouragement. Pt was given scheduled medications. Pt was encouraged to attend groups. Q 15 minute checks were done for safety.  R:Pt did not attend group. Pt is taking medication. Patient is receptive to treatment and safety maintained on unit.  Principal Problem: Major depressive disorder, single episode, severe with psychotic features (HCC) Diagnosis:   Patient Active Problem List   Diagnosis Date Noted  . Cannabis use disorder, severe, dependence (HCC) [F12.20] 01/10/2016  . Major depressive disorder, single episode, severe with psychotic features (HCC) [F32.3] 01/10/2016   Total Time spent with patient: 30 minutes  Past Psychiatric History: Denies prior psychiatric treatment, hospitalizations, suicidal attempts or self injury  Past Medical History: History reviewed. No pertinent past medical history. History reviewed. No pertinent surgical history.   Family History: History reviewed. No pertinent family history.   Family Psychiatric  History: Patient's father was an alcoholic. He is mother has some issues with mental health as per the patient  Social History: Originally from [place]. His mother and sister live in [place]. 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  He denies having any Insurance claims handler. He denies having any legal history. He is single, never married doesn't have any children. History  Alcohol use Not on file     History  Drug Use  . Types: Marijuana  Social History   Social History  . Marital status: Single    Spouse name: N/A  . Number of children: N/A  . Years of education: N/A   Social History Main Topics  . Smoking status: Never Smoker  . Smokeless tobacco: Never Used  . Alcohol use None  . Drug  use:     Types: Marijuana  . Sexual activity: Not Asked   Other Topics Concern  . None   Social History Narrative  . None     Current Medications: Current Facility-Administered Medications  Medication Dose Route Frequency Provider Last Rate Last Dose  . acetaminophen (TYLENOL) tablet 650 mg  650 mg Oral Q6H PRN Audery Amel, MD   650 mg at 01/13/16 2137  . alum & mag hydroxide-simeth (MAALOX/MYLANTA) 200-200-20 MG/5ML suspension 30 mL  30 mL Oral Q4H PRN Audery Amel, MD      . FLUoxetine (PROZAC) capsule 10 mg  10 mg Oral Daily Jimmy Footman, MD   10 mg at 01/14/16 1610  . LORazepam (ATIVAN) tablet 2 mg  2 mg Oral QHS Jimmy Footman, MD   2 mg at 01/13/16 2134  . magnesium hydroxide (MILK OF MAGNESIA) suspension 30 mL  30 mL Oral Daily PRN Audery Amel, MD      . risperiDONE (RISPERDAL) tablet 3 mg  3 mg Oral QHS Jimmy Footman, MD        Lab Results:  No results found for this or any previous visit (from the past 48 hour(s)).  Blood Alcohol level:  Lab Results  Component Value Date   ETH <5 01/08/2016    Metabolic Disorder Labs: Lab Results  Component Value Date   HGBA1C 5.3 01/10/2016   MPG 105 01/10/2016   Lab Results  Component Value Date   PROLACTIN 19.1 (H) 01/10/2016   Lab Results  Component Value Date   CHOL 121 01/10/2016   TRIG 53 01/10/2016   HDL 67 01/10/2016   CHOLHDL 1.8 01/10/2016   VLDL 11 01/10/2016   LDLCALC 43 01/10/2016    Physical Findings: AIMS:  , ,  ,  ,    CIWA:    COWS:     Musculoskeletal: Strength & Muscle Tone: within normal limits Gait & Station: normal Patient leans: N/A  Psychiatric Specialty Exam: Physical Exam  Constitutional: He is oriented to person, place, and time. He appears well-developed and well-nourished.  HENT:  Head: Normocephalic and atraumatic.  Eyes: Conjunctivae and EOM are normal.  Neck: Normal range of motion.  Musculoskeletal: Normal range of motion.   Neurological: He is alert and oriented to person, place, and time.    Review of Systems  Constitutional: Negative.   HENT: Negative.   Eyes: Negative.   Respiratory: Negative.   Cardiovascular: Negative.   Gastrointestinal: Negative.   Genitourinary: Negative.   Musculoskeletal: Negative.   Skin: Negative.   Neurological: Negative.   Endo/Heme/Allergies: Negative.   Psychiatric/Behavioral: Positive for depression and substance abuse. Negative for hallucinations, memory loss and suicidal ideas. The patient is nervous/anxious. The patient does not have insomnia.     Blood pressure (!) 153/94, pulse 82, temperature 99.5 F (37.5 C), temperature source Oral, resp. rate 16, height 5\' 10"  (1.778 m), weight 73 kg (161 lb), SpO2 98 %.Body mass index is 23.1 kg/m.  General Appearance: Fairly Groomed  Eye Contact:  Fair  Speech:  Clear and Coherent  Volume:  Decreased  Mood:  Dysphoric  Affect:  Blunt  Thought Process:  Disorganized and  Descriptions of Associations: Tangential  Orientation:  Full (Time, Place, and Person)  Thought Content:  Delusions and Tangential  Suicidal Thoughts:  Yes.  without intent/plan  Homicidal Thoughts:  No  Memory:  Immediate;   Fair Recent;   Fair Remote;   Fair  Judgement:  Poor  Insight:  Shallow  Psychomotor Activity:  Decreased  Concentration:  Concentration: Poor and Attention Span: Poor  Recall:  Fair  Fund of Knowledge:  Good  Language:  Good  Akathisia:  No  Handed:    AIMS (if indicated):     Assets:  Communication Skills Desire for Improvement Financial Resources/Insurance Housing Leisure Time Physical Health Social Support Talents/Skills Vocational/Educational  ADL's:  Intact  Cognition:  Impaired,  Mild  Sleep:  Number of Hours: 3.75     Treatment Plan Summary:  Mild improvement noted   Major depressive disorder with psychotic features: Appears at this point in time the patient might be having some delusions about the  safety of his mother and sister. He asked me twice during initial assessment if his mother was dead or alive. Also his thought process appears to be disorganized at times pointing to a diagnosis of major depressive disorder with psychotic features. He has been  started on fluoxetine 10 mg by mouth daily and Risperdal.  Today I will increase the Risperdal to 3 mg at bedtime.  Patient appears to be slowly improving however at times he continues to make very odd statements which to me are evidence of a thought disorder. Patient started to improve but yet again yesterday had an episode of bizarre behavior and thought process  For insomnia:   Continue Ativan 2 mg by mouth daily at bedtime  Cannabis use disorder severe: Patient is in need of intensive outpatient substance abuse treatment  Labs: TSH is within the normal limits. Urine toxicology only positive for cannabis. Alcohol level was below the detection limit CBC and compressive metabolic panel are within normal limits. Vit b12 was wnl  Precautions every 15 minute checks  Diet regular  Hospitalization and status continue involuntary commitment  Disposition: Once a stable he will be returning back to [facility]  Follow-up he will need to be a scheduled with a psychiatrist and he will need referral to substance abuse treatment  Allene Dillonorres Child psychotherapistsocial worker and I spoke with [employer]Pt's tennis Coach from [place], Michael Leonard, [phone].  Coaches concerned about how the patient will afford care after discharge as he has no financial help from family members. He feels the patient seems to be improving.  We discussed possibility of discharge early next week.  Angela BurkeMarie Shaw, Director of Counseling, [facility] [phone] contacted [facility] to report that [facility] will be following pt's case once discharged, but wanted hospital to be aware that there is no Psychiatrist on staff with [facility], and requests/recommends that IF  outpatient therapy is ordered on discharge that pt be referred to another source for psychiatric care.   Jimmy FootmanHernandez-Gonzalez,  Carizma Dunsworth, MD 01/14/2016, 12:56 PM

## 2016-01-14 NOTE — [facility] Group Notes (Signed)
[facility] LCSW Group Therapy 01/14/2016 Group Therapy: Unhelpful thinking:  Group reviewed types of unhelpful thinking, where we see them in our lives and the effects.  Group was able to verbalize examples of unhelpful thinking and practice affirmations using affirmation cards to counteract unhelpful thinking. Pt participation: Active, able to meet group goals Goals:  Pt able to provide 1-2 examples of unhelpful thinking they could relate to and identify how this effected their lives.  Utilized Affirmation cards to practice alternative ways of thinking and self-talk.  Jake SharkSara Janal Haak, MSW, LCSW

## 2016-01-14 NOTE — Progress Notes (Signed)
D:  Patient denies SI/AVH/HI.  Patient interacting appropriately with peers and staff. A:  Patient offered support and encouragement.  Patient administered scheduled medications. R:  Patient safety maintained with 15 minute checks.  Patient medication compliant.

## 2016-01-15 MED ORDER — WHITE PETROLATUM GEL
Status: DC | PRN
  Administered 2016-01-15: 15:00:00 via TOPICAL
  Filled 2016-01-15: qty 5

## 2016-01-15 NOTE — Plan of Care (Signed)
Problem: Coping: Goal: Ability to interact with others will improve Outcome: Progressing Up on unit this morning appropriately interacting with peers. No longer intrusive with interactions.

## 2016-01-15 NOTE — Progress Notes (Signed)
Pt reports being very sleepy this morning. Had to be called to dayroom for breakfast, and reports it was hard to wake himself. Reports fair sleep with help from night medications that are ordered. Reports good appetite, high energy level and good concentration on self inventory. Rates depression  1/10, hopelessness 0/10 and anxiety 1/10 (low 0-10 high). Noted to be interacting appropriately in dayroom with peers after breakfast. Denies SI/HI/AVH. Medication complaint. Appears in scrubs with body odor. Reports he showered yesterday.   Support and encouragement provided. Encouraged shower and groups today. Medications administered as ordered. Safety maintained with every 15 minute checks. Will continue to monitor.

## 2016-01-15 NOTE — Progress Notes (Signed)
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 Patient came in voluntarily to our emergency department along with his coach on October 1st reporting depression, thoughts of suicide, heavy use of cannabis and anxiety.  Wednesday the patient was quite disorganized. He was hyper focused on  Sexuality, masturbation, pornography .  Constantly preoccupied with not being able to have erections.  He felt overwhelmed and guilty about his behaviors and actions prior to admission.  Thursday asking whether or not he had a different personality when he spoke Spanish purses when he spoke [place]. Later on in the day he was again thinking that something had happened to his family he was found crying in his room.  Earlier in the admission the patient had voiced concerns that maybe his mother was dead however there is absolutely no indication that anything has happened to his mother.  On Friday the patient did well during the day but then at night again he started talking about masturbation and wanting to have sex his thought processes were disorganized overnight. He only slept 3.  Saturday was a better day for this patient. He did not display any disorganization in thought processes throughout the day. He still continues to struggle with insomnia only slept 5 hours last night however he says that after he woke up for vital signs he went back to sleep and slept to more hours." Thought process was linear and organized. He denies depressed mood, suicidality or side effects from medications other than mild sedation. He is hopeful and future oriented and looking forward to discharge early next week. Does have plans to not continue smoking marijuana and reach out to his support system at school. Denies major problems with appetite, energy or  concentration.   Per nursing Pt reports being very sleepy this morning. Had to be called to dayroom for breakfast, and reports it was hard to wake himself. Reports fair sleep with help from night medications that are ordered. Reports good appetite, high energy level and good concentration on self inventory. Rates depression  1/10, hopelessness 0/10 and anxiety 1/10 (low 0-10 high). Noted to be interacting appropriately in dayroom with peers after breakfast. Denies SI/HI/AVH. Medication complaint. Appears in scrubs with body odor. Reports he showered yesterday.   Support and encouragement provided. Encouraged shower and groups today. Medications administered as ordered. Safety maintained with every 15 minute checks. Will continue to monitor.   Principal Problem: Major depressive disorder, single episode, severe with psychotic features (HCC) Diagnosis:   Patient Active Problem List   Diagnosis Date Noted  . Cannabis use disorder, severe, dependence (HCC) [F12.20] 01/10/2016  . Major depressive disorder, single episode, severe with psychotic features (HCC) [F32.3] 01/10/2016   Total Time spent with patient: 30 minutes  Past Psychiatric History: Denies prior psychiatric treatment, hospitalizations, suicidal attempts or self injury  Past Medical History: History reviewed. No pertinent past medical history. History reviewed. No pertinent surgical history.   Family History: History reviewed. No pertinent family history.   Family Psychiatric  History: Patient's father was an alcoholic. He is mother has some issues with mental health as per the patient  Social History: Originally from [place]. His mother and sister live in [place]. 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  He denies having any Insurance claims handler. He denies having any  legal history. He is single, never married doesn't have any children. History  Alcohol use Not on file     History  Drug Use  . Types:  Marijuana    Social History   Social History  . Marital status: Single    Spouse name: N/A  . Number of children: N/A  . Years of education: N/A   Social History Main Topics  . Smoking status: Never Smoker  . Smokeless tobacco: Never Used  . Alcohol use None  . Drug use:     Types: Marijuana  . Sexual activity: Not Asked   Other Topics Concern  . None   Social History Narrative  . None     Current Medications: Current Facility-Administered Medications  Medication Dose Route Frequency Provider Last Rate Last Dose  . acetaminophen (TYLENOL) tablet 650 mg  650 mg Oral Q6H PRN Audery Amel, MD   650 mg at 01/13/16 2137  . alum & mag hydroxide-simeth (MAALOX/MYLANTA) 200-200-20 MG/5ML suspension 30 mL  30 mL Oral Q4H PRN Audery Amel, MD      . FLUoxetine (PROZAC) capsule 10 mg  10 mg Oral Daily Jimmy Footman, MD   10 mg at 01/15/16 0857  . LORazepam (ATIVAN) tablet 2 mg  2 mg Oral QHS Jimmy Footman, MD   2 mg at 01/14/16 2216  . magnesium hydroxide (MILK OF MAGNESIA) suspension 30 mL  30 mL Oral Daily PRN Audery Amel, MD      . risperiDONE (RISPERDAL) tablet 3 mg  3 mg Oral QHS Jimmy Footman, MD   3 mg at 01/14/16 2216    Lab Results:  No results found for this or any previous visit (from the past 48 hour(s)).  Blood Alcohol level:  Lab Results  Component Value Date   ETH <5 01/08/2016    Metabolic Disorder Labs: Lab Results  Component Value Date   HGBA1C 5.3 01/10/2016   MPG 105 01/10/2016   Lab Results  Component Value Date   PROLACTIN 19.1 (H) 01/10/2016   Lab Results  Component Value Date   CHOL 121 01/10/2016   TRIG 53 01/10/2016   HDL 67 01/10/2016   CHOLHDL 1.8 01/10/2016   VLDL 11 01/10/2016   LDLCALC 43 01/10/2016    Physical Findings: AIMS:  , ,  ,  ,    CIWA:    COWS:     Musculoskeletal: Strength & Muscle Tone: within normal limits Gait & Station: normal Patient leans: N/A  Psychiatric  Specialty Exam: Physical Exam  Constitutional: He is oriented to person, place, and time. He appears well-developed and well-nourished.  HENT:  Head: Normocephalic and atraumatic.  Eyes: Conjunctivae and EOM are normal.  Neck: Normal range of motion.  Musculoskeletal: Normal range of motion.  Neurological: He is alert and oriented to person, place, and time.    Review of Systems  Constitutional: Negative.   HENT: Negative.   Eyes: Negative.   Respiratory: Negative.   Cardiovascular: Negative.   Gastrointestinal: Negative.   Genitourinary: Negative.   Musculoskeletal: Negative.   Skin: Negative.   Neurological: Negative.   Endo/Heme/Allergies: Negative.   Psychiatric/Behavioral: Positive for depression and substance abuse. Negative for hallucinations, memory loss and suicidal ideas. The patient is nervous/anxious. The patient does not have insomnia.     Blood pressure 134/79, pulse 86, temperature 98 F (36.7 C), resp. rate 18, height 5\' 10"  (1.778 m), weight 73 kg (161 lb), SpO2 98 %.Body mass index is 23.1 kg/m.  General  Appearance: Fairly Groomed  Eye Contact:  Fair  Speech:  Clear and Coherent  Volume:  Decreased  Mood:  Dysphoric  Affect:  Blunt  Thought Process:  Disorganized and Descriptions of Associations: Tangential  Orientation:  Full (Time, Place, and Person)  Thought Content:  Delusions and Tangential  Suicidal Thoughts:  Yes.  without intent/plan  Homicidal Thoughts:  No  Memory:  Immediate;   Fair Recent;   Fair Remote;   Fair  Judgement:  Poor  Insight:  Shallow  Psychomotor Activity:  Decreased  Concentration:  Concentration: Poor and Attention Span: Poor  Recall:  Fair  Fund of Knowledge:  Good  Language:  Good  Akathisia:  No  Handed:    AIMS (if indicated):     Assets:  Communication Skills Desire for Improvement Financial Resources/Insurance Housing Leisure Time Physical Health Social Support Talents/Skills Vocational/Educational  ADL's:   Intact  Cognition:  Impaired,  Mild  Sleep:  Number of Hours: 5     Treatment Plan Summary:  Mild improvement noted   Major depressive disorder with psychotic features: Appears at this point in time the patient might be having some delusions about the safety of his mother and sister. He asked me twice during initial assessment if his mother was dead or alive. Also his thought process appears to be disorganized at times pointing to a diagnosis of major depressive disorder with psychotic features. He has been  started on fluoxetine 10 mg by mouth daily and Risperdal.    Continue fluoxetine 10 mg a day and Risperdal 3 mg at bedtime  Patient appears to be slowly improving however at times he continues to make very odd statements which to me are evidence of a thought disorder. Patient started to improve but yet again Friday had an episode of bizarre behavior and thought process  For insomnia:   Continue Ativan 2 mg by mouth daily at bedtime--is slept 5 hours last night  Cannabis use disorder severe: Patient is in need of intensive outpatient substance abuse treatment  Labs: TSH is within the normal limits. Urine toxicology only positive for cannabis. Alcohol level was below the detection limit CBC and compressive metabolic panel are within normal limits. Vit b12 was wnl  Precautions every 15 minute checks  Diet regular  Hospitalization and status continue involuntary commitment  Disposition: Once a stable he will be returning back to [facility]  Follow-up he will need to be a scheduled with a psychiatrist and he will need referral to substance abuse treatment  Allene Dillonorres Child psychotherapistsocial worker and I spoke with [employer]Pt's tennis Coach from [place], Michael Leonard, [phone].  Coaches concerned about how the patient will afford care after discharge as he has no financial help from family members. He feels the patient seems to be improving.  We discussed possibility of discharge early next  week.  Angela BurkeMarie Shaw, Director of Counseling, [facility] [phone] contacted [facility] to report that [facility] be following pt's case once discharged, but wanted hospital to be aware that there is no Psychiatrist on staff with [facility], and requests/recommends that IF outpatient therapy is ordered on discharge that pt be referred to another source for psychiatric care.   Jimmy FootmanHernandez-Gonzalez,  Noelia Lenart, MD 01/15/2016, 1:19 PM

## 2016-01-15 NOTE — [facility] Group Notes (Signed)
[facility] Group Notes:  (Nursing/MHT/Case Management/Adjunct)  Date:  01/15/2016  Time:  3:20 AM  Type of Therapy:  Psychoeducational Skills  Participation Level:  Active  Participation Quality:  Appropriate  Affect:  Appropriate  Cognitive:  Alert  Insight:  Good  Engagement in Group:  Engaged  Modes of Intervention:  Activity  Summary of Progress/Problems:  Dillon Shannon 01/15/2016, 3:20 AM

## 2016-01-16 MED ORDER — LORAZEPAM 2 MG PO TABS: 2.0000 mg | ORAL_TABLET | Freq: Every day | ORAL | 0 refills | Status: DC

## 2016-01-16 MED ORDER — RISPERIDONE 3 MG PO TABS: 3.0000 mg | ORAL_TABLET | Freq: Every day | ORAL | 0 refills | Status: DC

## 2016-01-16 MED ORDER — FLUOXETINE HCL 20 MG PO CAPS: 20.0000 mg | ORAL_CAPSULE | Freq: Every day | ORAL | 0 refills | Status: DC
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
## 2016-01-16 NOTE — Progress Notes (Signed)
D: Pt denies SI/HI/AVH. Pt is pleasant and cooperative. Pt's thoughts are organized no bizarre behavior noted,  he appears less anxious and he is interacting with peers and staff appropriately.  A: Pt was offered support and encouragement. Pt was given scheduled medications. Pt was encouraged to attend groups. Q 15 minute checks were done for safety.  R:Pt attends groups and interacts well with peers and staff. Pt is taking medication. Pt has no complaints.Pt receptive to treatment and safety maintained on unit.

## 2016-01-16 NOTE — Progress Notes (Signed)
Pleasant and cooperative.  Denies SI/HI/AVH.  Smiles easily.   Discharge instructions given, verbalized understanding.  Prescriptions given, personal belongings returned.  Escorted off unit by this Clinical research associatewriter to meet tennis coach to travel back to school.

## 2016-01-16 NOTE — [facility] Group Notes (Signed)
[facility] Group Notes:  (Nursing/MHT/Case Management/Adjunct)  Date:  01/16/2016  Time:  2:57 AM  Type of Therapy:  Psychoeducational Skills  Participation Level:  Active  Participation Quality:  Appropriate, Attentive and Sharing  Affect:  Appropriate  Cognitive:  Appropriate  Insight:  Appropriate and Good  Engagement in Group:  Engaged  Modes of Intervention:  Discussion, Socialization and Support  Summary of Progress/Problems:  Dillon Shannon 01/16/2016, 2:57 AM

## 2016-01-16 NOTE — Progress Notes (Signed)
  [facility] :  Will you be returning to the same living situation after discharge:  Yes,    At discharge, do you have transportation home?: Yes,    Do you have the ability to pay for your medications: Yes,     Release of information consent forms completed and in the chart;  Patient's signature needed at discharge.  Patient to Follow up at: Follow-up Information    [facility].   Why:  10:00am, New Patient appointment for Medication Management.  Please Call and reschedule if you're unable to attend. Contact information: [address]  [place], [place] [address]  (ph) [phone]   (fax) [phone] Discharge paperwork Attn: Delight OvensSerena       [facility]. Go on 01/25/2016.   Why:  1:45 pm, Please arrive for Hospital Follow up for Counseling.  Reschedule if you are unable to attend this appointment. Contact information: [address], [place], [place] [phone] FAX          Next level of care provider has access to [facility] Link:no  Safety Planning and Suicide Prevention discussed: Yes,     Have you used any form of tobacco in the last 30 days? (Cigarettes, Smokeless Tobacco, Cigars, and/or Pipes): Yes  Has patient been referred to the Quitline?: Patient refused referral  Patient has been referred for addiction treatment: Yes  Glennon MacSara P Ashleen Demma. MSW, LCSW 01/16/2016, 3:26 PM

## 2016-01-19 ENCOUNTER — Ambulatory Visit (INDEPENDENT_AMBULATORY_CARE_PROVIDER_SITE_OTHER): Payer: 59 | Admitting: Family Medicine

## 2016-01-19 ENCOUNTER — Encounter: Payer: Self-pay | Admitting: Family Medicine

## 2016-01-19 DIAGNOSIS — F419 Anxiety disorder, unspecified: Secondary | ICD-10-CM

## 2016-01-19 DIAGNOSIS — F329 Major depressive disorder, single episode, unspecified: Secondary | ICD-10-CM

## 2016-01-19 DIAGNOSIS — F32A Depression, unspecified: Secondary | ICD-10-CM

## 2016-02-03 ENCOUNTER — Other Ambulatory Visit: Payer: Self-pay | Admitting: Family Medicine

## 2016-02-03 DIAGNOSIS — F32A Depression, unspecified: Secondary | ICD-10-CM

## 2016-02-03 DIAGNOSIS — F329 Major depressive disorder, single episode, unspecified: Secondary | ICD-10-CM

## 2016-02-06 ENCOUNTER — Other Ambulatory Visit: Payer: Self-pay | Admitting: Family Medicine

## 2016-02-06 ENCOUNTER — Ambulatory Visit (INDEPENDENT_AMBULATORY_CARE_PROVIDER_SITE_OTHER): Payer: 59 | Admitting: Family Medicine

## 2016-02-06 ENCOUNTER — Encounter: Payer: Self-pay | Admitting: Family Medicine

## 2016-02-06 DIAGNOSIS — F32A Depression, unspecified: Secondary | ICD-10-CM

## 2016-02-06 DIAGNOSIS — F329 Major depressive disorder, single episode, unspecified: Secondary | ICD-10-CM

## 2016-02-06 MED ORDER — FLUOXETINE HCL 20 MG PO CAPS: 20.0000 mg | ORAL_CAPSULE | Freq: Every day | ORAL | 0 refills | Status: DC

## 2016-02-20 NOTE — Progress Notes (Signed)
Patient presents for follow-up regarding depression/anxiety. Patient states that he is doing better with his mood since he saw me last. He denies any suicidal or homicidal ideations. He has been trying to concentrate on touching up with his schoolwork. The tennis season is over now. He admits that he has not been using marijuana but has started to smoke cigarettes. He denies any alcohol use. He has not seen the drug and alcohol counselor yet which I have recommended. He is also not been able to establish with a psychiatrist in the area. He has seen a counselor on-campus however missed his follow-up appointment which was earlier this morning because he slept in. He states that he he feels like he is sleeping more. He denies any appetite issues. He states he continues to have a good support system through his teammates and coaches. He admits that he has misplaced his Prozac and has not been taking it for the last 4 or 5 days. He is continuing to take his respiratory and Ativan at night.  ROS: Negative except mentioned above. Vitals as per Epic.  GENERAL: NAD RESP: CTA B CARD: RRR NEURO: CN II-XII grossly intact, makes good eye contact  A/P: Depression/Anxiety - patient's mood appears to be better than when he saw me last, I have encouraged patient to continue catching up with his schoolwork and to ask for help through his academic advisor if needed, I've also asked that he try to be more responsible with his medication. I will refill his Prozac. I've asked that he not take his Ativan at night since this may be making him more sleepy since he was also prescribed Risperdal at night. I will speak to Dr. Maryruth BunKapur who is a psychiatrist in town to see if she will be willing to see him. We'll have to make sure that his insurance is accepted with her office. I have also asked the patient to continue his appointments with the counselor on-campus and not to miss any appointments. I have asked that he go by counseling  services today and inform them that he slept in an missed his appointment. I also continue to recommend to him that he see a drug/alcohol counselor. I have informed the head athletic trainer and also in athletic administrator that I would recommend that he do this. Patient agrees to seek immediate medical attention if he has any suicidal or homicidal thoughts.
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
## 2016-03-08 ENCOUNTER — Ambulatory Visit (INDEPENDENT_AMBULATORY_CARE_PROVIDER_SITE_OTHER): Payer: 59 | Admitting: Family Medicine

## 2016-03-08 DIAGNOSIS — F3289 Other specified depressive episodes: Secondary | ICD-10-CM

## 2016-03-08 DIAGNOSIS — J069 Acute upper respiratory infection, unspecified: Secondary | ICD-10-CM

## 2016-03-08 MED ORDER — AMOXICILLIN 500 MG PO CAPS: 500.0000 mg | ORAL_CAPSULE | Freq: Three times a day (TID) | ORAL | 0 refills | Status: DC

## 2016-03-08 MED ORDER — AMOXICILLIN 500 MG PO CAPS: 500.0000 mg | ORAL_CAPSULE | Freq: Two times a day (BID) | ORAL | 0 refills | Status: DC

## 2016-03-08 NOTE — Progress Notes (Signed)
Patient presents today for follow-up regarding depression. He also states that he has had some postnasal drip and cough for the last week. He describes yellow-green mucus. He denies any fever, chills, shortness of breath, chest pain, nausea, vomiting, diarrhea. He has not been taking any medications for his symptoms. Patient admits that a few days ago he decided to stop taking his prescribed medications for depression/anxiety. When asked why he states that he is unsure of whether he needs them anymore. He states that some days are better than others when it comes to his mood. He is no longer using marijuana. He occasionally drinks alcohol and smokes cigarettes. He states that it is a stressful time with exams coming up. He denies any suicidal or homicidal thoughts. He states that his appointment with Dr. Maryruth Shannon is on December 13. He just realized that he has an exam on that day so will have to reschedule. He missed his previous appointment with Dr. Maryruth Shannon because he arrived to the appointment to late. I stressed the importance of rescheduling this appointment and calling her office today. He admits to seeing Dillon Shannon in counseling services. He states he has only missed one appointment. I've asked that he continue to meet with Dillon Shannon at least once every 1-2 weeks until at least he has seen Dr. Maryruth Shannon. Patient agrees to calling Dr. Shelda Shannon's office and also continuing to meet with Dillon Shannon as above. He has had no problems with tennis and his support system from his friends and coaches. He has been in contact with his sister. He states she is doing well. His plans for Christmas break are to go home with a friend to [place]. This is the same friend he went home with for Thanksgiving break. He is then going to be spending some time in [place] with a friend. He is excited about his winter break.  ROS: Negative except mentioned above.  GENERAL: NAD HEENT: mild pharyngeal erythema, no exudate, no erythema of  TMs, no cervical LAD RESP: CTA B CARD: RRR NEURO: CN II-XII grossly intact, good eye contact  A/P: URI, Depression -patient to take Claritin when necessary, Amoxicillin for a week, cough suppressant when necessary, no athletic activity afebrile.  I recommended that patient continue taking his Prozac daily and using his Ativan at night only as needed. He can stop the Risperdal for now until he has reviewed these medications with Dr. Maryruth Shannon and then change based on her recommendations. Patient agrees to do this. I have also asked that he follow up with Dillon Shannon at least before break. He is to call Dr. Shelda Shannon's office today and reschedule his appointment. He also agrees to do this. He will seek immediate medical attention if any acute problems. Patient appreciative.

## 2016-06-01 ENCOUNTER — Encounter: Payer: Self-pay | Admitting: Family Medicine

## 2016-06-01 ENCOUNTER — Ambulatory Visit (INDEPENDENT_AMBULATORY_CARE_PROVIDER_SITE_OTHER): Payer: 59 | Admitting: Family Medicine

## 2016-06-01 VITALS — BP 116/62 | HR 71 | Temp 98.1°F | Resp 14

## 2016-06-01 DIAGNOSIS — F329 Major depressive disorder, single episode, unspecified: Secondary | ICD-10-CM

## 2016-06-01 DIAGNOSIS — F32A Depression, unspecified: Secondary | ICD-10-CM

## 2016-06-01 NOTE — Progress Notes (Signed)
Patient presents today for follow-up regarding his depression/anxiety. Patient states that he is feeling well and doesn't have any acute concerns today. He is meeting with the psychiatrist, Dr. Doyne Keel in approximately 2 weeks. Patient denies any problems with sleep or appetite. He states that school work and tennis are going well. He admits that he has not been taking his psychiatric medications that were prescribed for him. He feels that his mood is fine right now so does not want to start any medication. He recently met with coach and the athletic administrator to sign a contract regarding his following through with mental health appointments, etc. I encouraged Dillon Shannon to talk to the psychiatrist about his difficult childhood and the anxiety he feels playing tennis at [employer] and not being back in his country with his sister. Also to discuss his substance abuse problem with marijuana in the past. He denies any substance abuse at this time. Patient at this time also denies any suicidal or homicidal thoughts. I explained to Dillon Shannon that he may need to see a therapist or counselor who will be recommended by the psychiatrist on a regular basis. Dillon Shannon addresses understanding of this. I also explained to him that he would have to sign a release form if any information that he discusses with the psychiatrist he would want to have shared with me.

## 2016-06-14 ENCOUNTER — Encounter (INDEPENDENT_AMBULATORY_CARE_PROVIDER_SITE_OTHER): Payer: Self-pay

## 2016-06-14 ENCOUNTER — Ambulatory Visit (INDEPENDENT_AMBULATORY_CARE_PROVIDER_SITE_OTHER): Payer: 59 | Admitting: Psychiatry

## 2016-06-14 ENCOUNTER — Encounter (HOSPITAL_COMMUNITY): Payer: Self-pay | Admitting: Psychiatry

## 2016-06-14 VITALS — BP 110/68 | HR 57 | Ht 71.0 in | Wt 171.4 lb

## 2016-06-14 DIAGNOSIS — F4322 Adjustment disorder with anxiety: Secondary | ICD-10-CM

## 2016-06-14 DIAGNOSIS — Z811 Family history of alcohol abuse and dependence: Secondary | ICD-10-CM

## 2016-06-14 DIAGNOSIS — F1290 Cannabis use, unspecified, uncomplicated: Secondary | ICD-10-CM

## 2016-06-14 DIAGNOSIS — F17210 Nicotine dependence, cigarettes, uncomplicated: Secondary | ICD-10-CM

## 2016-06-14 DIAGNOSIS — Z818 Family history of other mental and behavioral disorders: Secondary | ICD-10-CM
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
## 2016-07-27 ENCOUNTER — Emergency Department: Payer: 59

## 2016-07-27 ENCOUNTER — Encounter: Payer: Self-pay | Admitting: Emergency Medicine

## 2016-07-27 ENCOUNTER — Emergency Department
Admission: EM | Admit: 2016-07-27 | Discharge: 2016-07-27 | Disposition: A | Discharge status: Home / Self Care | Payer: 59 | Attending: Student in an Organized Health Care Education/Training Program | Admitting: Student in an Organized Health Care Education/Training Program

## 2016-07-27 DIAGNOSIS — R569 Unspecified convulsions: Secondary | ICD-10-CM | POA: Diagnosis not present

## 2016-07-27 DIAGNOSIS — Z79899 Other long term (current) drug therapy: Secondary | ICD-10-CM | POA: Diagnosis not present

## 2016-07-27 DIAGNOSIS — F17210 Nicotine dependence, cigarettes, uncomplicated: Secondary | ICD-10-CM | POA: Insufficient documentation

## 2016-07-27 LAB — URINALYSIS, COMPLETE (UACMP) WITH MICROSCOPIC
BACTERIA UA: NONE SEEN
BILIRUBIN URINE: NEGATIVE
GLUCOSE, UA: NEGATIVE mg/dL
Hgb urine dipstick: NEGATIVE
Ketones, ur: NEGATIVE mg/dL
LEUKOCYTES UA: NEGATIVE
NITRITE: NEGATIVE
Protein, ur: NEGATIVE mg/dL
RBC / HPF: NONE SEEN RBC/hpf (ref 0–5)
SPECIFIC GRAVITY, URINE: 1.011 (ref 1.005–1.030)
Squamous Epithelial / LPF: NONE SEEN
WBC, UA: NONE SEEN WBC/hpf (ref 0–5)
pH: 6 (ref 5.0–8.0)

## 2016-07-27 LAB — URINE DRUG SCREEN, QUALITATIVE (ARMC ONLY)
Amphetamines, Ur Screen: NOT DETECTED
BARBITURATES, UR SCREEN: NOT DETECTED
Benzodiazepine, Ur Scrn: NOT DETECTED
COCAINE METABOLITE, UR ~~LOC~~: NOT DETECTED
Cannabinoid 50 Ng, Ur ~~LOC~~: POSITIVE — AB
MDMA (ECSTASY) UR SCREEN: NOT DETECTED
Methadone Scn, Ur: NOT DETECTED
OPIATE, UR SCREEN: NOT DETECTED
PHENCYCLIDINE (PCP) UR S: NOT DETECTED
Tricyclic, Ur Screen: NOT DETECTED

## 2016-07-27 LAB — BASIC METABOLIC PANEL
ANION GAP: 6 (ref 5–15)
BUN: 20 mg/dL (ref 6–20)
CALCIUM: 9 mg/dL (ref 8.9–10.3)
CO2: 28 mmol/L (ref 22–32)
Chloride: 104 mmol/L (ref 101–111)
Creatinine, Ser: 0.98 mg/dL (ref 0.61–1.24)
GFR calc non Af Amer: >60 mL/min (ref >60)
Glucose, Bld: 88 mg/dL (ref 65–99)
Potassium: 4.7 mmol/L (ref 3.5–5.1)
SODIUM: 138 mmol/L (ref 135–145)

## 2016-07-27 LAB — CBC WITH DIFFERENTIAL/PLATELET
BASOS ABS: 0 10*3/uL (ref 0–0.1)
BASOS PCT: 0 %
Eosinophils Absolute: 0.1 10*3/uL (ref 0–0.7)
Eosinophils Relative: 1 %
HEMATOCRIT: 41.4 % (ref 40.0–52.0)
Hemoglobin: 14 g/dL (ref 13.0–18.0)
Lymphocytes Relative: 16 %
Lymphs Abs: 1.2 10*3/uL (ref 1.0–3.6)
MCH: 32.1 pg (ref 26.0–34.0)
MCHC: 33.8 g/dL (ref 32.0–36.0)
MCV: 94.8 fL (ref 80.0–100.0)
MONO ABS: 0.6 10*3/uL (ref 0.2–1.0)
Monocytes Relative: 8 %
NEUTROS ABS: 5.6 10*3/uL (ref 1.4–6.5)
NEUTROS PCT: 75 %
Platelets: 180 10*3/uL (ref 150–440)
RBC: 4.37 MIL/uL — ABNORMAL LOW (ref 4.40–5.90)
RDW: 12.8 % (ref 11.5–14.5)
WBC: 7.4 10*3/uL (ref 3.8–10.6)

## 2016-07-27 LAB — CK: Total CK: 215 U/L (ref 49–397)

## 2016-07-27 MED ORDER — SODIUM CHLORIDE 0.9 % IV BOLUS (SEPSIS): 1000.0000 mL | Freq: Once | INTRAVENOUS | Status: DC

## 2016-07-27 NOTE — Discharge Instructions (Signed)
DO NOT DRIVE UNTIL CLEARED BY NEUROLOGY

## 2016-07-27 NOTE — ED Provider Notes (Signed)
[facility] Emergency Department Provider Note    First MD Initiated Contact with Patient 07/27/16 1311     (approximate)  I have reviewed the triage vital signs and the nursing notes.   HISTORY  Chief Complaint Seizures    HPI Dillon Shannon is a 21 y.o. male who presents with seizure occurred while at tennis practice.  Patient states he woke up this AM with tingling sensation to right arm which he thinks was from sleeping on neck wrong.  Then felt completely well throughout the day and went to practice.  Started feeling warm and tingling sensation going up his arm into his chest,. Down his legs and to his head.  He then fell to the ground, lost consciousness and was reportedly unresponsive for 1 min followed by post ictal period.  Currently in no distress. n numbness or tingling.  This is the first time that this has happened.   Past Medical History:  Diagnosis Date  . Depression 01/2016   Family History  Problem Relation Age of Onset  . Hypertension Father   . Alcohol abuse Father   . Depression Paternal Grandfather   . Suicidality Paternal Grandfather   . Bipolar disorder Neg Hx    Past Surgical History:  Procedure Laterality Date  . TONSILLECTOMY     Patient Active Problem List   Diagnosis Date Noted  . Cannabis use disorder, severe, dependence (HCC) 01/10/2016  . Major depressive disorder, single episode, severe with psychotic features (HCC) 01/10/2016      Prior to Admission medications   Not on File    Allergies Patient has no known allergies.    Social History Social History  Substance Use Topics  . Smoking status: Current Some Day Smoker    Packs/day: 0.25    Types: Cigarettes  . Smokeless tobacco: Never Used  . Alcohol use No    Review of Systems Patient denies headaches, rhinorrhea, blurry vision, numbness, shortness of breath, chest pain, edema, cough, abdominal pain, nausea, vomiting, diarrhea, dysuria,  fevers, rashes or hallucinations unless otherwise stated above in HPI. ____________________________________________   PHYSICAL EXAM:  VITAL SIGNS: Vitals:   07/27/16 1334  BP: 118/72  Pulse: 75  Resp: 18  Temp: 98.4 F (36.9 C)    Constitutional: Alert and oriented. Well appearing and in no acute distress. Eyes: Conjunctivae are normal. PERRL. EOMI. Head: Atraumatic. Nose: No congestion/rhinnorhea. Mouth/Throat: Mucous membranes are moist.  Oropharynx non-erythematous. Neck: No stridor. Painless ROM. No cervical spine tenderness to palpation Hematological/Lymphatic/Immunilogical: No cervical lymphadenopathy. Cardiovascular: Normal rate, regular rhythm. Grossly normal heart sounds.  Good peripheral circulation. Respiratory: Normal respiratory effort.  No retractions. Lungs CTAB. Gastrointestinal: Soft and nontender. No distention. No abdominal bruits. No CVA tenderness. Genitourinary:  Musculoskeletal: No lower extremity tenderness nor edema.  No joint effusions. Neurologic:  Normal speech and language. No gross focal neurologic deficits are appreciated. No gait instability. Skin:  Skin is warm, dry and intact. No rash noted. Psychiatric: Mood and affect are normal. Speech and behavior are normal.  ____________________________________________   LABS (all labs ordered are listed, but only abnormal results are displayed)  Results for orders placed or performed during the hospital encounter of 07/27/16 (from the past 24 hour(s))  CBC with Differential/Platelet     Status: Abnormal   Collection Time: 07/27/16  1:35 PM  Result Value Ref Range   WBC 7.4 3.8 - 10.6 K/uL   RBC 4.37 (L) 4.40 - 5.90 MIL/uL   Hemoglobin 14.0 13.0 -  18.0 g/dL   HCT 16.1 09.6 - 04.5 %   MCV 94.8 80.0 - 100.0 fL   MCH 32.1 26.0 - 34.0 pg   MCHC 33.8 32.0 - 36.0 g/dL   RDW 40.9 81.1 - 91.4 %   Platelets 180 150 - 440 K/uL   Neutrophils Relative % 75 %   Neutro Abs 5.6 1.4 - 6.5 K/uL   Lymphocytes  Relative 16 %   Lymphs Abs 1.2 1.0 - 3.6 K/uL   Monocytes Relative 8 %   Monocytes Absolute 0.6 0.2 - 1.0 K/uL   Eosinophils Relative 1 %   Eosinophils Absolute 0.1 0 - 0.7 K/uL   Basophils Relative 0 %   Basophils Absolute 0.0 0 - 0.1 K/uL  Basic metabolic panel     Status: None   Collection Time: 07/27/16  1:35 PM  Result Value Ref Range   Sodium 138 135 - 145 mmol/L   Potassium 4.7 3.5 - 5.1 mmol/L   Chloride 104 101 - 111 mmol/L   CO2 28 22 - 32 mmol/L   Glucose, Bld 88 65 - 99 mg/dL   BUN 20 6 - 20 mg/dL   Creatinine, Ser 7.82 0.61 - 1.24 mg/dL   Calcium 9.0 8.9 - 95.6 mg/dL   GFR calc non Af Amer >60 >60 mL/min   GFR calc Af Amer >60 >60 mL/min   Anion gap 6 5 - 15  CK     Status: None   Collection Time: 07/27/16  1:38 PM  Result Value Ref Range   Total CK 215 49 - 397 U/L  Urinalysis, Complete w Microscopic     Status: Abnormal   Collection Time: 07/27/16  3:06 PM  Result Value Ref Range   Color, Urine STRAW (A) YELLOW   APPearance CLEAR (A) CLEAR   Specific Gravity, Urine 1.011 1.005 - 1.030   pH 6.0 5.0 - 8.0   Glucose, UA NEGATIVE NEGATIVE mg/dL   Hgb urine dipstick NEGATIVE NEGATIVE   Bilirubin Urine NEGATIVE NEGATIVE   Ketones, ur NEGATIVE NEGATIVE mg/dL   Protein, ur NEGATIVE NEGATIVE mg/dL   Nitrite NEGATIVE NEGATIVE   Leukocytes, UA NEGATIVE NEGATIVE   RBC / HPF NONE SEEN 0 - 5 RBC/hpf   WBC, UA NONE SEEN 0 - 5 WBC/hpf   Bacteria, UA NONE SEEN NONE SEEN   Squamous Epithelial / LPF NONE SEEN NONE SEEN   Mucous PRESENT   Urine Drug Screen, Qualitative ([facility] only)     Status: Abnormal   Collection Time: 07/27/16  3:06 PM  Result Value Ref Range   Tricyclic, Ur Screen NONE DETECTED NONE DETECTED   Amphetamines, Ur Screen NONE DETECTED NONE DETECTED   MDMA (Ecstasy)Ur Screen NONE DETECTED NONE DETECTED   Cocaine Metabolite,Ur [place] NONE DETECTED NONE DETECTED   Opiate, Ur Screen NONE DETECTED NONE DETECTED   Phencyclidine (PCP) Ur S NONE DETECTED NONE  DETECTED   Cannabinoid 50 Ng, Ur [place] POSITIVE (A) NONE DETECTED   Barbiturates, Ur Screen NONE DETECTED NONE DETECTED   Benzodiazepine, Ur Scrn NONE DETECTED NONE DETECTED   Methadone Scn, Ur NONE DETECTED NONE DETECTED   ____________________________________________  EKG My review and personal interpretation at Time: 13:31   Indication: syncope  Rate: 75  Rhythm: sinus Axis: normal Other: J point elevation in V3,  BER,  No STEMI, no evidence of wpw, brugada ____________________________________________  RADIOLOGY  I personally reviewed all radiographic images ordered to evaluate for the above acute complaints and reviewed radiology reports and findings.  These findings were personally discussed with the patient.  Please see medical record for radiology report. _________________________________________   PROCEDURES  Procedure(s) performed:  Procedures    Critical Care performed: no ____________________________________________   INITIAL IMPRESSION / ASSESSMENT AND PLAN / ED COURSE  Pertinent labs & imaging results that were available during my care of the patient were reviewed by me and considered in my medical decision making (see chart for details).  DDX: seizure, cva, pseudoseizure, electrolye abn, dehydration  Geremiah Pound is a 21 y.o. who presents to the ED with seizure-like today as described above.  Patient is AFVSS in ED. Exam as above. Given current presentation have considered the above differential.  Neuro intact at this point. CT imaging shows no evidence of traumatic injury or mass. No evidence of dysrhythmia. Electrolytes are normal. No recurrent episodes. I spoke with Dr. Allena Katz, the patient's physician, who agrees to help arrange follow-up with neurology. The patient will not be cleared for athletic activity until seen by neurology.  Instructed not to drive.  Have discussed with the patient and available family all diagnostics and treatments performed thus far and  all questions were answered to the best of my ability. The patient demonstrates understanding and agreement with plan.        ____________________________________________   FINAL CLINICAL IMPRESSION(S) / ED DIAGNOSES  Final diagnoses:  Seizure-like activity (HCC)      NEW MEDICATIONS STARTED DURING THIS VISIT:  New Prescriptions   No medications on file     Note:  This document was prepared using Dragon voice recognition software and may include unintentional dictation errors.    Willy Eddy, MD 07/27/16 [phone]

## 2016-07-27 NOTE — ED Notes (Signed)
Transported to CT 

## 2016-07-27 NOTE — ED Notes (Signed)
AAOx3.  Taking in PO well.  No N/V.  MAE equally and strong.  Ambulates with easy and steady gait.

## 2016-07-27 NOTE — ED Triage Notes (Signed)
Pt arrived via EMS from Tennis courts at [place]. EMS reports pt was warming up at tennis match when witnesses report a grand-mal seizure x 1.5 mins with a post-ictal period of about 7 mins.  EMS on scene reports he was post-ictal when they arrived.  Event organiser in room.

## 2016-07-30 ENCOUNTER — Encounter: Payer: Self-pay | Admitting: Family Medicine

## 2016-07-30 ENCOUNTER — Ambulatory Visit (INDEPENDENT_AMBULATORY_CARE_PROVIDER_SITE_OTHER): Payer: 59 | Admitting: Family Medicine

## 2016-07-30 VITALS — BP 126/72 | HR 72 | Temp 98.0°F | Resp 14

## 2016-07-30 DIAGNOSIS — R569 Unspecified convulsions: Secondary | ICD-10-CM

## 2016-07-30 NOTE — Progress Notes (Signed)
Patient presents today after an incident on Friday before his tennis match. Patient states that that morning he went to breakfast and then came home and took a short nap before his match. When he got up he felt like his right arm was numb. He attributed this to sleeping on it wrong. The symptoms in the arm went away before practicing. During practice before his match the symptoms happened again and his right arm locked up. He states that then he started feeling a flushed feeling going up his arm into his neck and into his head. He states then he collapsed. He believes a few minutes past and he then regained consciousness. He was on the court at the time and there were people helping him. EMS arrived and took the patient to the ER. At the ER the patient had a CT of his head and labs. He was then discharged. He was told to follow up with neurology and to return back to the ER if he had any further symptoms. Patient states that he felt fine after leaving the ER. He denies having a headache. He felt tired the next day and slept a lot. He denies any symptoms at this time. He denies any previous history of seizures. He does admit to having marijuana week prior to the incident. He states that his marijuana use is occasional now and not like it was before. He does smoke cigarettes on occasion still. He denies any alcohol use. He denies taking any other medications. He denies any history of head trauma or injury.  ROS: Negative except mentioned above. Vitals as per Epic. GENERAL: NAD HEENT: no pharyngeal erythema, no exudate, no erythema of TMs, no cervical LAD RESP: CTA B CARD: RRR MSK: No midline cervical tenderness, full range of motion of the neck, negative Spurling's, 5 out of 5 strength of extremities NEURO: CN II-XII grossly intact, negative Romberg's  A/P: Seizure - unsure whether patient truly experienced a seizure, would recommend evaluation and treatment by Neurology, patient will see Dr. Sherryll Burger this  week, he is not to drive or ride his bike, no athletic activity for now. He will seek medical attention if any symptoms occur. He declines seeing a drug/alcohol counselor. He states that he does have an appointment Dr. Michae Kava in May. I advised him to call and confirm his appointment date and time. Patient agrees to do this. He denies any suicidal or homicidal ideations.

## 2016-08-06 ENCOUNTER — Other Ambulatory Visit: Payer: Self-pay | Admitting: Family Medicine

## 2016-08-06 ENCOUNTER — Encounter: Payer: Self-pay | Admitting: Family Medicine

## 2016-08-06 ENCOUNTER — Ambulatory Visit (INDEPENDENT_AMBULATORY_CARE_PROVIDER_SITE_OTHER): Payer: 59 | Admitting: Family Medicine

## 2016-08-06 VITALS — BP 123/71 | HR 68 | Temp 98.4°F | Resp 16

## 2016-08-06 DIAGNOSIS — Z0283 Encounter for blood-alcohol and blood-drug test: Secondary | ICD-10-CM

## 2016-08-06 DIAGNOSIS — J029 Acute pharyngitis, unspecified: Secondary | ICD-10-CM

## 2016-08-06 LAB — POCT RAPID STREP A (OFFICE): RAPID STREP A SCREEN: NEGATIVE

## 2016-08-06 MED ORDER — AMOXICILLIN 500 MG PO CAPS: 500.0000 mg | ORAL_CAPSULE | Freq: Two times a day (BID) | ORAL | 0 refills | Status: AC

## 2016-08-06 NOTE — Progress Notes (Signed)
Patient presents today with symptoms of mild sore throat and rhinorrhea. Patient denies any fever or chills. He states he has had symptoms for the last few days. He denies any severe headache, fatigue, abdominal pain or rash.  Patient also presents today for follow-up regarding possible seizure that he had on the tennis court recently. He did have an evaluation by Dr. Sherryll Burger. An EEG, MRI and some labs have been ordered. Patient states that he has to go back on May 4 to have this done. He currently is not playing tennis and awaiting the results from the studies. He was told not to drive a car and to minimize his use of his bike from Dr. Sherryll Burger. He has not been put on any antiseizure medication. This will be discussed if needed after above studies have been done. Patient denies any further seizure activity since that one incident.  Patient also has been asked to do a drug test by his coach. This is being done because the coach is suspicious of the patient using drugs. Patient admits that he did use marijuana recently. He states that he felt since the tennis season was technically over that it would be okay for him to use marijuana. Patient does have a long history of marijuana use. He has told me in the past that he only occasionally uses marijuana now as opposed to before on almost a daily basis. He still denies that he has a problem with drugs and states that if he wanted to he could stop it at any time without having a problem. He denies any other drug use or alcohol use.  Patient is currently not on any medication for depression or anxiety by his psychiatrist. He states that he does have follow-up appointment with his psychiatrist in early May. He is unsure still of the date but thinks that it is next week.  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 He is going to be staying with one of his teammates.  ROS: Negative except mentioned above. Vitals as per  Epic. GENERAL: NAD HEENT: mild to moderate pharyngeal erythema, no exudate, no erythema of TMs, mild cervical LAD RESP: CTA B CARD: RRR NEURO: CN II-XII grossly intact   A/P: 1) Pharyngitis- rapid strep test ?positive, rest, hydration, Claritin when necessary, Ibuprofen when necessary, if his symptoms get worse in the next 1-2 days he can go pick up Amoxicillin from the pharmacy, seek medical attention if symptoms persist or worsen as discussed.  2) Possible seizure- has further workup regarding this by neurologist this week, follow instructions as neurologist has spelled out. Seek medical attention if any symptoms recur. We will follow Dr. Margaretmary Eddy recommendations.  3) Drug screen- coach is suspicious of patient using drugs, athletic trainer will take patient now to get drug tested at [facility], form given to patient, discussed at length with patient that I feel that he would benefit from a drug/alcohol counselor. Patient still feels that he is able to quit marijuana without any assistance. He will have to discussed the implications of the positive drug test with his coach/athletic department.  4) Mental health- is not on any medications currently for depression or anxiety per psychiatrist, I have asked the patient to make sure that he calls his psychiatrist's office to confirm his appointment time and date so that he does not miss it. Patient denies any suicidal or homicidal ideations at this time.

## 2016-08-08 ENCOUNTER — Other Ambulatory Visit: Payer: Self-pay | Admitting: Neurology

## 2016-08-08 DIAGNOSIS — R569 Unspecified convulsions: Secondary | ICD-10-CM

## 2016-08-09 ENCOUNTER — Ambulatory Visit (HOSPITAL_COMMUNITY): Payer: 59 | Admitting: Psychiatry

## 2016-08-10 LAB — 5+CRT-BUND
AMPHETAMINES, URINE: NEGATIVE ng/mL
Cocaine (Metab.): NEGATIVE ng/mL
Creatinine, Urine: 126.3 mg/dL (ref 20.0–300.0)
OPIATE QUANTITATIVE URINE: NEGATIVE ng/mL
PCP QUANT UR: NEGATIVE ng/mL
PH OF URINE: 5.5 (ref 4.5–8.9)

## 2016-08-10 LAB — CANNABINOID CONFIRMATION, UR
CANNABINOIDS: POSITIVE — AB
CARBOXY THC GC/MS CONF: 132 ng/mL

## 2016-08-18 ENCOUNTER — Ambulatory Visit
Admission: RE | Admit: 2016-08-18 | Discharge: 2016-08-18 | Disposition: A | Discharge status: Home / Self Care | Payer: 59 | Source: Ambulatory Visit | Attending: Neurology | Admitting: Neurology

## 2016-08-18 DIAGNOSIS — R569 Unspecified convulsions: Secondary | ICD-10-CM

## 2016-08-18 MED ORDER — GADOBENATE DIMEGLUMINE 529 MG/ML IV SOLN
15.0000 mL | Freq: Once | INTRAVENOUS | Status: AC | PRN
  Administered 2016-08-18: 15 mL via INTRAVENOUS

## 2017-06-10 IMAGING — MR MR HEAD WO/W CM
13 series · 48 of 48 positions shown · IV contrast (multihance)
Comparison: Prior CT from 07/27/2016.

CLINICAL DATA: Initial evaluation for seizures.

EXAM:
MRI HEAD WITHOUT AND WITH CONTRAST
TECHNIQUE: Multiplanar, multiecho pulse sequences of the brain and surrounding
structures were obtained without and with intravenous contrast.
CONTRAST:  15mL MULTIHANCE GADOBENATE DIMEGLUMINE 529 MG/ML IV SOLN

[Series 2: T1 · sagittal · 5.0mm · 0.45mm/px · 1 of 27 slices shown (1 of 2)]
[im 1/27]
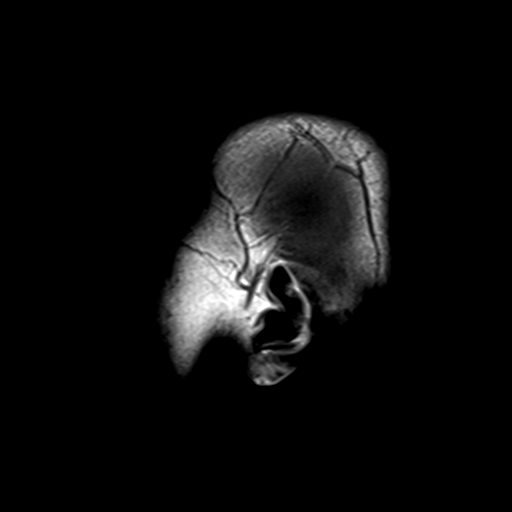

[Series 4: DWI · axial · 3.0mm · 1.80mm/px · z∈[-56,+105]mm · 3 of 55 slices shown (1 of 2)]
[im 1/55]
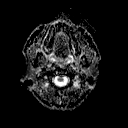
[im 28/55]
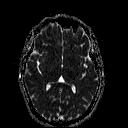
[im 55/55]
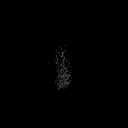

[Series 6: DWI · coronal · 3.0mm · 1.80mm/px · 3 of 45 slices shown (2 of 2)]
[im 1/45]
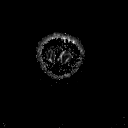
[im 23/45]
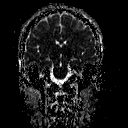
[im 45/45]
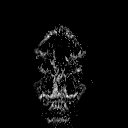

[Series 7: T2 · axial · 5.0mm · 0.60mm/px · z∈[-53,+102]mm · 2 of 25 slices shown (1 of 3)]
[im 1/25]
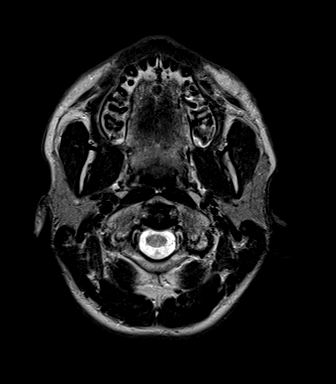
[im 25/25]
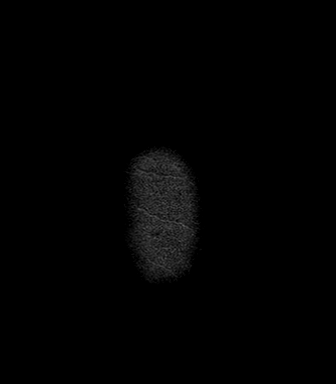

[Series 8: FLAIR · axial · 3.0mm · 0.45mm/px · z∈[-53,+102]mm · 3 of 53 slices shown]
[im 1/53]
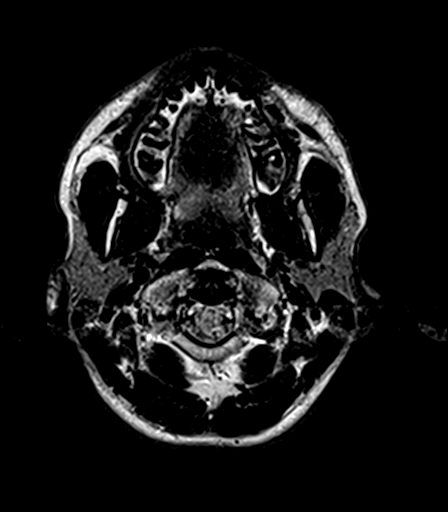
[im 27/53]
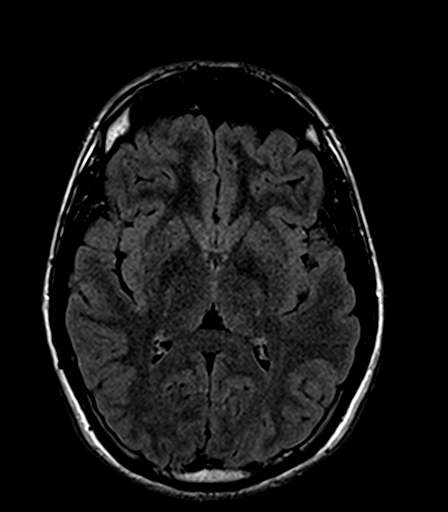
[im 53/53]
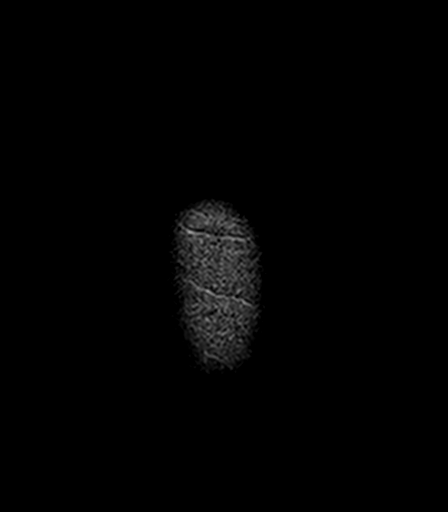

[Series 9: T2 · axial · 5.0mm · 0.45mm/px · z∈[-53,+102]mm · 2 of 25 slices shown (2 of 3)]
[im 1/25]
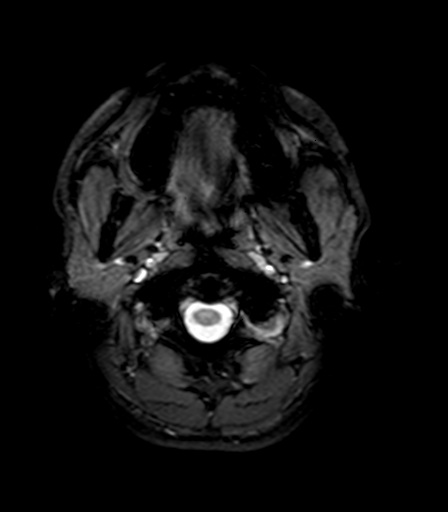
[im 25/25]
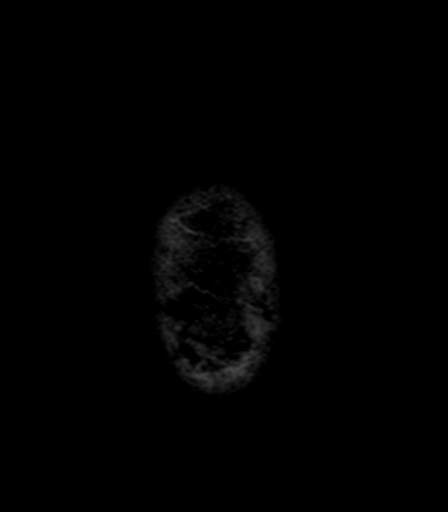

[Series 10: T1 · axial · 1.0mm · 1.00mm/px · z∈[-61,+113]mm · 11 of 176 slices shown (2 of 2)]
[im 1/176]
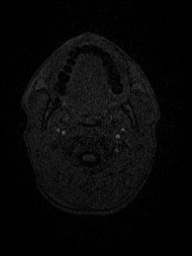
[im 18/176]
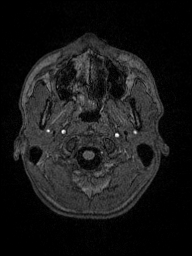
[im 36/176]
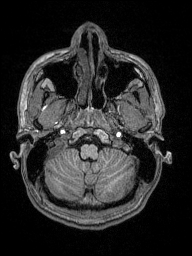
[im 53/176]
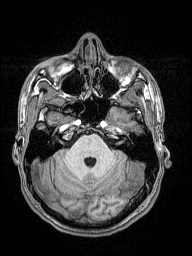
[im 71/176]
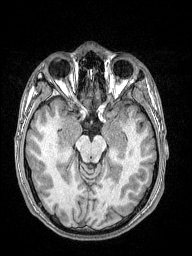
[im 88/176]
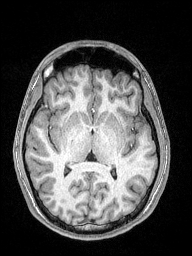
[im 106/176]
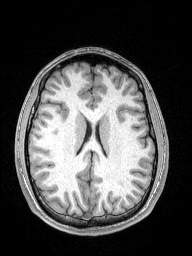
[im 123/176]
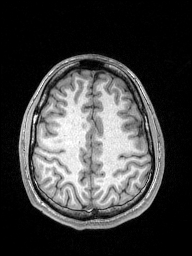
[im 141/176]
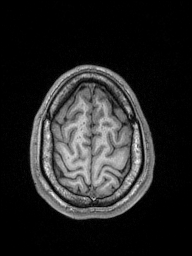
[im 158/176]
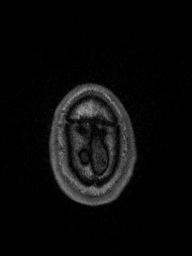
[im 176/176]
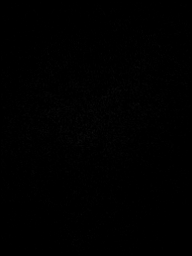

[Series 11: T2 · coronal · 4.0mm · 0.60mm/px · 2 of 31 slices shown (3 of 3)]
[im 1/31]
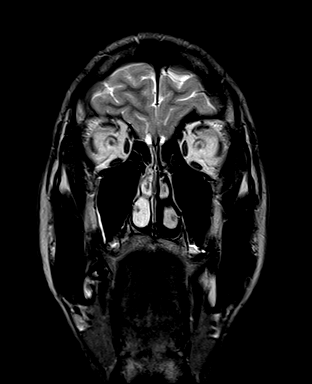
[im 31/31]
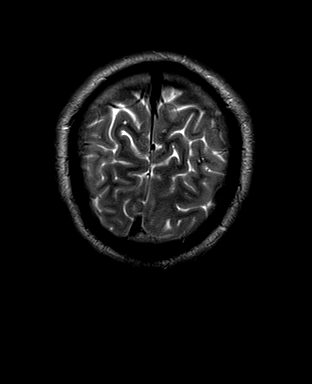

[Series 12: T2 post-contrast · coronal · 5.0mm · 0.49mm/px · 2 of 27 slices shown]
[im 1/27]
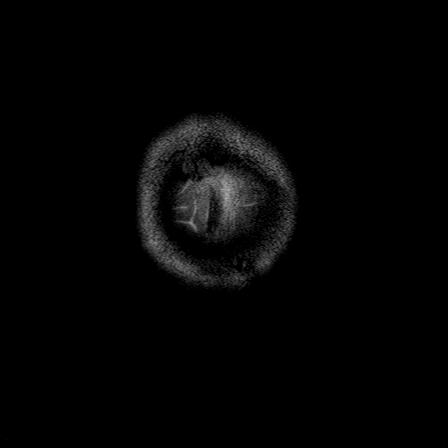
[im 27/27]
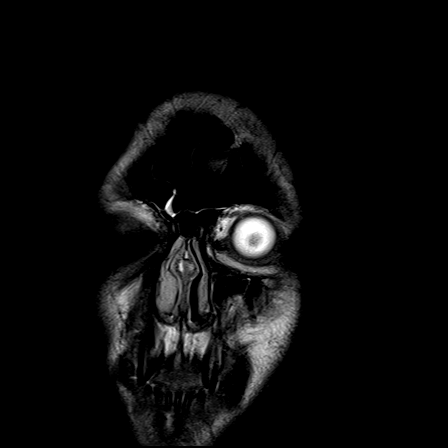

[Series 13: T1 post-contrast · coronal · 5.0mm · 0.43mm/px · 2 of 27 slices shown (1 of 2)]
[im 1/27]
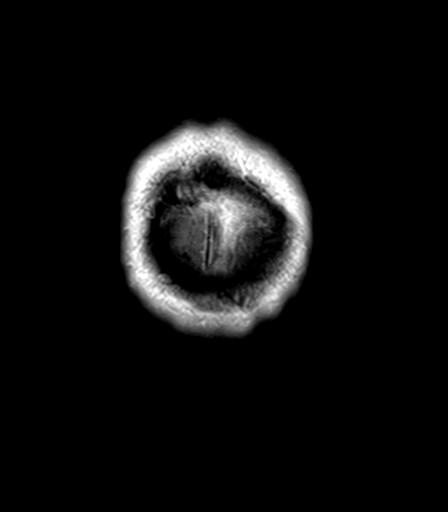
[im 27/27]
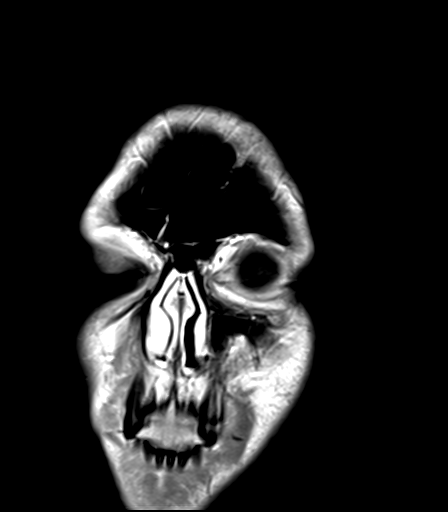

[Series 14: T1 post-contrast · axial · 1.0mm · 1.00mm/px · z∈[-61,+113]mm · 11 of 176 slices shown (2 of 2)]
[im 1/176]
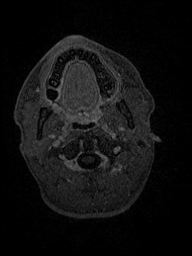
[im 18/176]
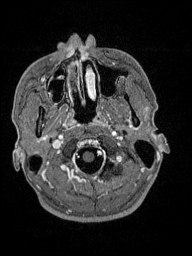
[im 36/176]
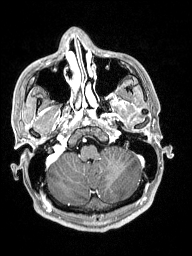
[im 53/176]
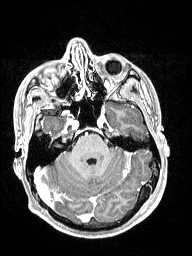
[im 71/176]
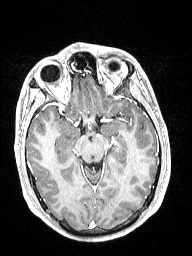
[im 88/176]
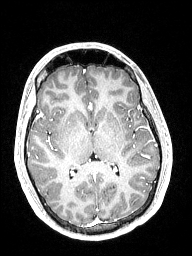
[im 106/176]
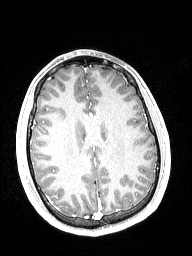
[im 123/176]
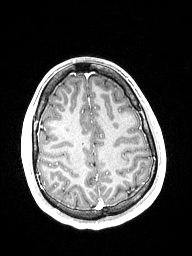
[im 141/176]
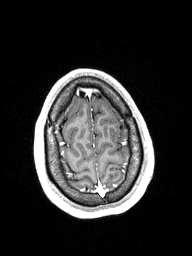
[im 158/176]
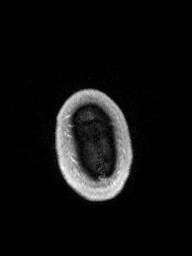
[im 176/176]
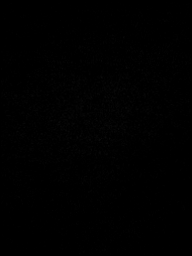

[Series 100: ax (id) · axial · 3.0mm · 1.80mm/px · z∈[-56,+105]mm · 3 of 55 slices shown]
[im 1/55]
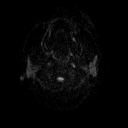
[im 28/55]
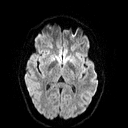
[im 55/55]
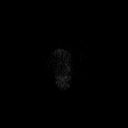

[Series 101: cor (id) · coronal · 3.0mm · 1.80mm/px · 3 of 44 slices shown]
[im 1/44]
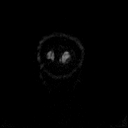
[im 22/44]
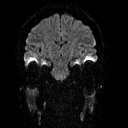
[im 44/44]
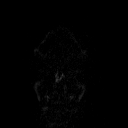

[48 of 48 positions shown; findings below may reference images not displayed]

FINDINGS: Brain: Study mildly degraded by motion artifact.

Cerebral volume within normal limits for age. Cortical sulcation is
normal. No focal parenchymal signal abnormality.

No abnormal foci of restricted diffusion to suggest acute or
subacute ischemia. Gray-white matter differentiation well
maintained. No foci of susceptibility artifact to suggest acute or
chronic intracranial hemorrhage. No encephalomalacia to suggest
chronic infarction.

No mass lesion, midline shift or mass effect. Ventricles normal in
size without evidence for hydrocephalus. No extra-axial fluid
collection. Major dural sinuses are patent. No intrinsic temporal
lobe abnormality. No abnormal enhancement.

Pituitary gland suprasellar region within normal limits. Midline
structures intact and normal.

Vascular: Major intracranial vascular flow voids are well
maintained.

Skull and upper cervical spine: Craniocervical junction normal. No
Chiari malformation. Visualized upper cervical spine unremarkable.
Bone marrow signal intensity within normal limits. No scalp soft
tissue abnormality.

Sinuses/Orbits: Globes and oval soft tissues within normal limits.
Mild scattered mucosal thickening within the ethmoidal air cells and
right sphenoid sinus. Paranasal sinuses are otherwise clear. No
mastoid effusion. Inner ear structures normal.

Other: No other significant finding.
IMPRESSION: Normal MRI of the brain.
